# Patient Record
Sex: Female | Born: 1960 | Race: White | Hispanic: No | Marital: Married | Smoking: Never smoker
Health system: Southern US, Community
[De-identification: ages and names within clinical notes are randomized; demographics above are authoritative.]

## PROBLEM LIST (undated history)

## (undated) DIAGNOSIS — C801 Malignant (primary) neoplasm, unspecified: Secondary | ICD-10-CM

## (undated) DIAGNOSIS — I10 Essential (primary) hypertension: Secondary | ICD-10-CM

## (undated) DIAGNOSIS — E049 Nontoxic goiter, unspecified: Secondary | ICD-10-CM

## (undated) DIAGNOSIS — M5090 Cervical disc disorder, unspecified, unspecified cervical region: Secondary | ICD-10-CM

## (undated) DIAGNOSIS — E042 Nontoxic multinodular goiter: Secondary | ICD-10-CM

## (undated) HISTORY — PX: CHOLECYSTECTOMY: SHX55

## (undated) HISTORY — DX: Essential (primary) hypertension: I10

## (undated) HISTORY — DX: Cervical disc disorder, unspecified, unspecified cervical region: M50.90

## (undated) HISTORY — DX: Nontoxic goiter, unspecified: E04.9

## (undated) HISTORY — DX: Nontoxic multinodular goiter: E04.2

---

## 2007-07-08 DIAGNOSIS — E042 Nontoxic multinodular goiter: Secondary | ICD-10-CM

## 2007-07-08 HISTORY — DX: Nontoxic multinodular goiter: E04.2

## 2007-12-08 ENCOUNTER — Encounter: Admission: RE | Admit: 2007-12-08 | Discharge: 2007-12-08 | Payer: Self-pay | Admitting: Family Medicine

## 2007-12-08 ENCOUNTER — Ambulatory Visit: Payer: Self-pay | Admitting: Family Medicine

## 2007-12-08 DIAGNOSIS — M25519 Pain in unspecified shoulder: Secondary | ICD-10-CM

## 2007-12-13 ENCOUNTER — Encounter: Payer: Self-pay | Admitting: Family Medicine

## 2007-12-15 ENCOUNTER — Encounter: Payer: Self-pay | Admitting: Family Medicine

## 2008-01-07 ENCOUNTER — Ambulatory Visit: Payer: Self-pay | Admitting: Family Medicine

## 2008-01-07 DIAGNOSIS — I10 Essential (primary) hypertension: Secondary | ICD-10-CM

## 2008-01-11 ENCOUNTER — Encounter: Payer: Self-pay | Admitting: Family Medicine

## 2008-01-11 ENCOUNTER — Ambulatory Visit: Payer: Self-pay | Admitting: Family Medicine

## 2008-01-11 ENCOUNTER — Encounter: Admission: RE | Admit: 2008-01-11 | Discharge: 2008-01-11 | Payer: Self-pay | Admitting: Family Medicine

## 2008-01-11 ENCOUNTER — Observation Stay (HOSPITAL_COMMUNITY): Admission: AD | Admit: 2008-01-11 | Discharge: 2008-01-13 | Payer: Self-pay | Admitting: Family Medicine

## 2008-01-11 DIAGNOSIS — M542 Cervicalgia: Secondary | ICD-10-CM | POA: Insufficient documentation

## 2008-01-11 DIAGNOSIS — E042 Nontoxic multinodular goiter: Secondary | ICD-10-CM

## 2008-01-31 ENCOUNTER — Ambulatory Visit: Payer: Self-pay | Admitting: Family Medicine

## 2008-02-24 ENCOUNTER — Telehealth: Payer: Self-pay | Admitting: Family Medicine

## 2008-02-29 ENCOUNTER — Encounter: Payer: Self-pay | Admitting: Family Medicine

## 2008-03-06 ENCOUNTER — Encounter: Payer: Self-pay | Admitting: Family Medicine

## 2008-03-17 ENCOUNTER — Telehealth (INDEPENDENT_AMBULATORY_CARE_PROVIDER_SITE_OTHER): Payer: Self-pay | Admitting: *Deleted

## 2008-03-21 ENCOUNTER — Ambulatory Visit: Payer: Self-pay | Admitting: Family Medicine

## 2008-03-24 ENCOUNTER — Encounter: Payer: Self-pay | Admitting: Family Medicine

## 2008-03-30 ENCOUNTER — Encounter: Admission: RE | Admit: 2008-03-30 | Discharge: 2008-03-30 | Payer: Self-pay | Admitting: Neurology

## 2008-04-11 ENCOUNTER — Ambulatory Visit: Payer: Self-pay | Admitting: Family Medicine

## 2008-04-12 LAB — CONVERTED CEMR LAB
CO2: 25 meq/L (ref 19–32)
Calcium: 9.1 mg/dL (ref 8.4–10.5)
Glucose, Bld: 89 mg/dL (ref 70–99)
Potassium: 4.6 meq/L (ref 3.5–5.3)
Sodium: 136 meq/L (ref 135–145)

## 2008-05-05 ENCOUNTER — Encounter: Payer: Self-pay | Admitting: Family Medicine

## 2008-06-19 ENCOUNTER — Encounter: Admission: RE | Admit: 2008-06-19 | Discharge: 2008-06-19 | Payer: Self-pay | Admitting: Sports Medicine

## 2008-07-03 ENCOUNTER — Encounter: Admission: RE | Admit: 2008-07-03 | Discharge: 2008-07-05 | Payer: Self-pay | Admitting: Sports Medicine

## 2008-07-10 ENCOUNTER — Encounter: Admission: RE | Admit: 2008-07-10 | Discharge: 2008-07-18 | Payer: Self-pay | Admitting: Sports Medicine

## 2008-08-14 ENCOUNTER — Ambulatory Visit: Payer: Self-pay | Admitting: Family Medicine

## 2008-08-18 ENCOUNTER — Encounter: Admission: RE | Admit: 2008-08-18 | Discharge: 2008-08-18 | Payer: Self-pay | Admitting: Family Medicine

## 2008-09-17 ENCOUNTER — Telehealth: Payer: Self-pay | Admitting: Family Medicine

## 2008-11-09 ENCOUNTER — Telehealth: Payer: Self-pay | Admitting: Family Medicine

## 2008-12-11 ENCOUNTER — Ambulatory Visit: Payer: Self-pay | Admitting: Family Medicine

## 2008-12-11 DIAGNOSIS — R42 Dizziness and giddiness: Secondary | ICD-10-CM

## 2009-09-26 ENCOUNTER — Telehealth: Payer: Self-pay | Admitting: Family Medicine

## 2009-10-02 ENCOUNTER — Ambulatory Visit: Payer: Self-pay | Admitting: Family Medicine

## 2009-12-14 ENCOUNTER — Ambulatory Visit: Payer: Self-pay | Admitting: Family Medicine

## 2010-08-06 NOTE — Progress Notes (Signed)
Summary: Change BP med  Phone Note Call from Patient Call back at Home Phone [phone]   Caller: Patient Call For: Nani Gasser MD Summary of Call: Pt on Benicar for her BP and too expensive. Says you changed her husband to Losartan and if she could get this it is free on her plan. Please advise Initial call taken by: Kathlene November,  September 26, 2009 9:01 AM  Follow-up for Phone Call        Hasn'ty been seen in over one year for BP. so needs to come in for appt and we can change her at that time.  Follow-up by: Nani Gasser MD,  September 26, 2009 9:54 AM  Additional Follow-up for Phone Call Additional follow up Details #1::        Pt notified of MD instructions Additional Follow-up by: Kathlene November,  September 26, 2009 10:01 AM

## 2010-08-06 NOTE — Assessment & Plan Note (Signed)
Summary: 3 WK FU HTN   Vital Signs:  Patient Profile:   50 Years Old Female Height:     63.3 inches Weight:      158 pounds Pulse rate:   73 / minute BP sitting:   129 / 77  (left arm) Cuff size:   regular  Vitals Entered By: Kathlene November (April 11, 2008 9:02 AM)                 PCP:  Seymour Bars DO  Chief Complaint:  BP followup.  History of Present Illness: Here to FU BP.   Hypertension History:      She denies headache, chest pain, palpitations, dyspnea with exertion, orthopnea, PND, peripheral edema, visual symptoms, neurologic problems, syncope, and side effects from treatment.  She notes no problems with any antihypertensive medication side effects.        Positive major cardiovascular risk factors include hypertension.  Negative major cardiovascular risk factors include female age less than 49 years old, negative family history for ischemic heart disease, and non-tobacco-user status.       Current Allergies: ! SULFA      Physical Exam  General:     Well-developed,well-nourished,in no acute distress; alert,appropriate and cooperative throughout examination Head:     Normocephalic and atraumatic without obvious abnormalities. No apparent alopecia or balding. Eyes:     No corneal or conjunctival inflammation noted. EOMI. Perrla.  Neck:     No deformities, masses, or tenderness noted. Lungs:     Normal respiratory effort, chest expands symmetrically. Lungs are clear to auscultation, no crackles or wheezes. Heart:     Normal rate and regular rhythm. S1 and S2 normal without gallop, murmur, click, rub or other extra sounds.  No carotid bruits.     Impression & Recommendations:  Problem # 1:  ESSENTIAL HYPERTENSION, BENIGN (ICD-401.1) AT goal today.   Due to check K and Cr since starting an ARB.   Encourage to restart walking program.  Fu in 4 months. Due for cholesterol check as well.  Her updated medication list for this problem includes:    Cardizem  Cd 300 Mg Xr24h-cap (Diltiazem hcl coated beads) .Marland Kitchen... Take 1 tablet by mouth once a day    Benicar 20 Mg Tabs (Olmesartan medoxomil) .Marland Kitchen... Take 1 tablet by mouth once a day  BP today: 129/77 Prior BP: 141/94 (03/21/2008)  Prior 10 Yr Risk Heart Disease: Not enough information (03/21/2008)  Orders: T-Basic Metabolic Panel [phone]) T-Lipid Profile [phone])   Complete Medication List: 1)  Naproxen Dr 500 Mg Tbec (Naproxen) .... Take 1 tablet by mouth two times a day 2)  Diazepam 10 Mg Tabs (Diazepam) .Marland Kitchen.. 1 tab by mouth three times a day as needed neck pain/ spasm 3)  Percocet 5-325 Mg Tabs (Oxycodone-acetaminophen) .Marland Kitchen.. 1-2 tabs by mouth q 6 hrs as needed severe pain 4)  Cardizem Cd 300 Mg Xr24h-cap (Diltiazem hcl coated beads) .... Take 1 tablet by mouth once a day 5)  Benicar 20 Mg Tabs (Olmesartan medoxomil) .... Take 1 tablet by mouth once a day  Hypertension Assessment/Plan:      The patient's hypertensive risk group is category A: No risk factors and no target organ damage.  Today's blood pressure is 129/77.      Prescriptions: CARDIZEM CD 300 MG XR24H-CAP (DILTIAZEM HCL COATED BEADS) Take 1 tablet by mouth once a day  #30 x 5   Entered and Authorized by:   Nani Gasser MD  Signed by:   Nani Gasser MD on 04/11/2008   Method used:   [facility]ically to        [facility] [phone]* (retail)       [address] [place], [place]  [address]       Ph: [phone] or [phone]       Fax: [phone]   RxID:   [id number]  ]

## 2010-08-06 NOTE — Assessment & Plan Note (Signed)
Summary: BP CHECK//VGJ  Nurse Visit   Vital Signs:  Patient profile:   50 year old female Pulse rate:   74 / minute BP sitting:   124 / 77  (left arm) Cuff size:   regular  Vitals Entered By: Kathlene November (December 14, 2009 1:34 PM) CC: Follow-up HTN HPI: Taking Meds?yes Side Effects?no Chest Pain, SOB, Dizziness?no A/P: HTN(401.1) At Goal? If no, MD will be notified. Follow-up in--  5 minutes was spent with the pt.  Current Medications (verified): 1)  Cardizem Cd 300 Mg Xr24h-Cap (Diltiazem Hcl Coated Beads) .... Take 1 Tablet By Mouth Once A Day 2)  Veramyst 27.5 Mcg/spray Susp (Fluticasone Furoate) .... 2 Sprays Per Nostril Once Daily 3)  Losartan Potassium 50 Mg Tabs (Losartan Potassium) .... Take 1 Tablet By Mouth Once A Day in The Am.  Allergies (verified): 1)  ! Sulfa  Orders Added: 1)  Est. Patient Level I [[address]109] Prescriptions: LOSARTAN POTASSIUM 50 MG TABS (LOSARTAN POTASSIUM) Take 1 tablet by mouth once a day in the AM.  #30 x 4   Entered by:   Kathlene November   Authorized by:   Seymour Bars DO   Signed by:   Kathlene November on 12/14/2009   Method used:   [facility]ronically to        [facility] [phone]* (retail)       [address] [place], [place]  [address]       Ph: [phone] or [phone]       Fax: [phone]   RxID:   [id number]

## 2010-08-06 NOTE — Assessment & Plan Note (Signed)
Summary: HTN, Goiter   Vital Signs:  Patient profile:   50 year old female Height:      63.3 inches Weight:      164 pounds BMI:     28.88 Pulse rate:   83 / minute BP sitting:   121 / 75  (left arm) Cuff size:   regular  Vitals Entered By: Kathlene November (October 02, 2009 9:04 AM) CC: followup BP, Hypertension Management   CC:  followup BP and Hypertension Management.  Hypertension History:      She denies headache, chest pain, palpitations, dyspnea with exertion, orthopnea, PND, peripheral edema, visual symptoms, neurologic problems, syncope, and side effects from treatment.  She notes no problems with any antihypertensive medication side effects.  c/o occ swellingin her feet if eats too much salt. Marland Kitchen        Positive major cardiovascular risk factors include hypertension.  Negative major cardiovascular risk factors include female age less than 42 years old, negative family history for ischemic heart disease, and non-tobacco-user status.     Current Medications (verified): 1)  Cardizem Cd 300 Mg Xr24h-Cap (Diltiazem Hcl Coated Beads) .... Take 1 Tablet By Mouth Once A Day 2)  Benicar 20 Mg Tabs (Olmesartan Medoxomil) .... Take 1 Tablet By Mouth Once A Day 3)  Veramyst 27.5 Mcg/spray Susp (Fluticasone Furoate) .... 2 Sprays Per Nostril Once Daily  Allergies (verified): 1)  ! Sulfa  Comments:  Nurse/Medical Assistant: The patient's medications and allergies were reviewed with the patient and were updated in the Medication and Allergy Lists. Kathlene November (October 02, 2009 9:04 AM)  Physical Exam  General:  Well-developed,well-nourished,in no acute distress; alert,appropriate and cooperative throughout examination Head:  Normocephalic and atraumatic without obvious abnormalities. No apparent alopecia or balding. Neck:  Enlarged thyroid bilaterally.  Lungs:  Normal respiratory effort, chest expands symmetrically. Lungs are clear to auscultation, no crackles or wheezes. Heart:  Normal  rate and regular rhythm. S1 and S2 normal without gallop, murmur, click, rub or other extra sounds. Skin:  no rashes.   Cervical Nodes:  No lymphadenopathy noted Psych:  Cognition and judgment appear intact. Alert and cooperative with normal attention span and concentration. No apparent delusions, illusions, hallucinations   Impression & Recommendations:  Problem # 1:  ESSENTIAL HYPERTENSION, BENIGN (ICD-401.1) At goal. lOoks great. Wants to change to losartan for cost reasons.  Will change and then f/u to recheck pressure in 3 weeks.  Also due for labs and fasting cholesterol.  Call if any SE or concerns.  The following medications were removed from the medication list:    Benicar 20 Mg Tabs (Olmesartan medoxomil) .Marland Kitchen... Take 1 tablet by mouth once a day Her updated medication list for this problem includes:    Cardizem Cd 300 Mg Xr24h-cap (Diltiazem hcl coated beads) .Marland Kitchen... Take 1 tablet by mouth once a day    Losartan Potassium 50 Mg Tabs (Losartan potassium) .Marland Kitchen... Take 1 tablet by mouth once a day in the am.  BP today: 121/75 Prior BP: 151/91 (12/11/2008)  Prior 10 Yr Risk Heart Disease: Not enough information (03/21/2008)  Labs Reviewed: K+: 4.6 (04/11/2008) Creat: : 0.71 (04/11/2008)     Orders: T-Comprehensive Metabolic Panel ([id number]) T-Lipid Profile ([id number])  Problem # 2:  GOITER, MULTINODULAR (ICD-241.1) Due to recheck the TSH.  Last [place] was stable in 08/2008.   Orders: T-TSH ([id number])  Complete Medication List: 1)  Cardizem Cd 300 Mg Xr24h-cap (Diltiazem hcl coated beads) .... Take 1 tablet by mouth once a  day 2)  Veramyst 27.5 Mcg/spray Susp (Fluticasone furoate) .... 2 sprays per nostril once daily 3)  Losartan Potassium 50 Mg Tabs (Losartan potassium) .... Take 1 tablet by mouth once a day in the am.  Hypertension Assessment/Plan:      The patient's hypertensive risk group is category A: No risk factors and no target organ damage.  Today's blood pressure  is 121/75.  Her blood pressure goal is < 140/90.  Patient Instructions: 1)  Please schedule a follow-up appointment in 3 weeks to recheck Blood pressure.   Then can send a 90 day supply.   2)  We will call you with your lab results. Need to fast for your labs.   Prescriptions: LOSARTAN POTASSIUM 50 MG TABS (LOSARTAN POTASSIUM) Take 1 tablet by mouth once a day in the AM.  #30 x 0   Entered and Authorized by:   Nani Gasser MD   Signed by:   Nani Gasser MD on 10/02/2009   Method used:   Electronically to        [facility] [phone]* (retail)       [address] [place], [place]  [address]       Ph: [phone] or [phone]       Fax: [phone]   RxID:   [id number]

## 2010-09-24 ENCOUNTER — Other Ambulatory Visit: Payer: Self-pay | Admitting: Family Medicine

## 2010-09-24 ENCOUNTER — Telehealth: Payer: Self-pay | Admitting: *Deleted

## 2010-09-24 DIAGNOSIS — I10 Essential (primary) hypertension: Secondary | ICD-10-CM

## 2010-09-24 MED ORDER — LOSARTAN POTASSIUM 50 MG PO TABS: 50.0000 mg | ORAL_TABLET | Freq: Every day | ORAL | Status: DC

## 2010-09-24 NOTE — Telephone Encounter (Signed)
CValled in med. Pt aware to pick up.

## 2010-09-26 ENCOUNTER — Encounter: Payer: Self-pay | Admitting: Family Medicine

## 2010-10-02 ENCOUNTER — Ambulatory Visit (INDEPENDENT_AMBULATORY_CARE_PROVIDER_SITE_OTHER): Payer: BC Managed Care – PPO | Admitting: Family Medicine

## 2010-10-02 VITALS — BP 124/76 | HR 82 | Ht 63.3 in | Wt 165.0 lb

## 2010-10-02 DIAGNOSIS — E010 Iodine-deficiency related diffuse (endemic) goiter: Secondary | ICD-10-CM

## 2010-10-02 DIAGNOSIS — I10 Essential (primary) hypertension: Secondary | ICD-10-CM

## 2010-10-02 DIAGNOSIS — N390 Urinary tract infection, site not specified: Secondary | ICD-10-CM

## 2010-10-02 DIAGNOSIS — E049 Nontoxic goiter, unspecified: Secondary | ICD-10-CM

## 2010-10-02 LAB — POCT URINALYSIS DIPSTICK
Blood, UA: NEGATIVE
Glucose, UA: NEGATIVE
Leukocytes, UA: NEGATIVE
Nitrite, UA: NEGATIVE

## 2010-10-02 MED ORDER — CIPROFLOXACIN HCL 500 MG PO TABS: 500.0000 mg | ORAL_TABLET | Freq: Two times a day (BID) | ORAL | Status: AC

## 2010-10-02 MED ORDER — LOSARTAN POTASSIUM 50 MG PO TABS: 50.0000 mg | ORAL_TABLET | Freq: Every day | ORAL | Status: DC

## 2010-10-02 MED ORDER — DILTIAZEM HCL ER COATED BEADS 300 MG PO CP24: 300.0000 mg | ORAL_CAPSULE | Freq: Every day | ORAL | Status: DC

## 2010-10-02 MED ORDER — FLUTICASONE FUROATE 27.5 MCG/SPRAY NA SUSP: 2.0000 | Freq: Every day | NASAL | Status: DC

## 2010-10-02 NOTE — Progress Notes (Signed)
  Subjective:    Patient ID: Joy Perez, female    DOB: March 03, 1961, 50 y.o.   MRN: [id number]  Hypertension This is a chronic problem. Pertinent negatives include no chest pain, headaches, malaise/fatigue or shortness of breath. There are no associated agents to hypertension. Risk factors for coronary artery disease include no known risk factors. Past treatments include ACE inhibitors.  Urinary Tract Infection  The current episode started 1 to 4 weeks ago. The problem occurs intermittently. The problem has been gradually worsening. The quality of the pain is described as aching. The pain is mild (achey bilateral lowre quad and CVA area.). There has been no fever. There is no history of pyelonephritis. Associated symptoms include frequency. Pertinent negatives include no hematuria. She has tried nothing for the symptoms. There is no history of catheterization or recurrent UTIs.  She is c/o leaking of urine for 2 weeks. Odor is very strong.  Feels a tugging sensation when has to urinate. Symptoms have been abrupt.     Review of Systems  Constitutional: Negative for malaise/fatigue.  Respiratory: Negative for shortness of breath.   Cardiovascular: Negative for chest pain.  Genitourinary: Positive for frequency. Negative for hematuria.  Neurological: Negative for headaches.   Past Surgical History  Procedure Date  . Cholecystectomy    History   Social History  . Marital Status: Married    Spouse Name: N/A    Number of Children: N/A  . Years of Education: N/A   Occupational History  . Not on file.   Social History Main Topics  . Smoking status: Never Smoker   . Smokeless tobacco: Not on file  . Alcohol Use: No  . Drug Use:   . Sexually Active:    Other Topics Concern  . Not on file   Social History Narrative  . No narrative on file      Objective:   Physical Exam  Constitutional: She appears well-developed and well-nourished.  HENT:  Head: Normocephalic and  atraumatic.  Neck: Normal range of motion. Thyromegaly present.  Cardiovascular: Normal rate, regular rhythm and normal heart sounds.   Pulmonary/Chest: Effort normal and breath sounds normal.          Assessment & Plan:  1. HTN-  At goal. Due for labs.  2. Incontinence- With sudden onset like infection, though UA neg. Will send cx to confirm. Tx with cipro. Call if sxs not better. Then can conisder tx for incontinence or possible urology referral.  3.MNG - Recheck TSH. Had w/u about a year ago.

## 2010-10-02 NOTE — Patient Instructions (Signed)
Complete the antibiotics for the UTI. Call me if you are not feeling better in about 4-5 days.

## 2010-10-04 LAB — URINE CULTURE: Organism ID, Bacteria: NO GROWTH

## 2010-10-08 ENCOUNTER — Telehealth: Payer: Self-pay | Admitting: *Deleted

## 2010-10-08 NOTE — Telephone Encounter (Signed)
Message copied by Avon Gully on Tue Oct 08, 2010  2:58 PM ------      Message from: Nani Gasser      Created: Fri Oct 04, 2010  8:27 AM       Call pt: Urine cx is neg.

## 2010-10-08 NOTE — Telephone Encounter (Signed)
Left message on vm with results  

## 2010-11-19 NOTE — H&P (Signed)
NAME:  Joy Perez, Joy Perez NO.:  [id number]   MEDICAL RECORD NO.:  [id number]          PATIENT TYPE:  EMS   LOCATION:  [facility]                         FACILITY:  [facility]   PHYSICIAN:  Nestor Ramp, MD        DATE OF BIRTH:  Mar 11, 1961   DATE OF ADMISSION:  01/11/2008  DATE OF DISCHARGE:  01/11/2008                              HISTORY & PHYSICAL   PRIMARY CARE PHYSICIAN:  Seymour Bars, DO, at [facility] [place]   CHIEF COMPLAINT:  Neck pain.   HISTORY OF PRESENT ILLNESS:  The patient is a 50 year old female  transferred from [facility] for admission  for neck pain.   The patient states over the past month she has had worsening of neck  pain.  She states the pain started with discomfort on the right side  under her ear, which felt like she might have an ear infection or like  something was compressing that side.  She states that Wednesday she woke  up with a crick in her neck posteriorly and her neck felt very sore  and it was hard to move.  This progressed over the past few days until  Sunday when she was in severe pain and went to the ED at [facility].  She  has been taking ibuprofen, Vicodin, Valium without help and states they  just make her fall asleep.  She has no radiation of pain down her arms.  No bowel or bladder dysfunction.  Denies fevers.  She has had difficulty  swallowing, but states the pain from this is posterior neck pain and it  does not hurt in her throat when she swallows.  She denies shaking  chills, sweats, palpitations, dyspnea, or chest pain.  At the emergency  department per report, she had a negative lumbar puncture for  meningitis.  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  Blood pressure is elevated and  this has not been issue over the past few years since she  has lost 70  pounds on weight watchers.   PAST MEDICAL HISTORY:  None currently.   PAST SURGICAL HISTORY:  Status post cholecystectomy.   MEDICATIONS:  The patient takes no regular medications, but has been  taking naproxen, Valium, and Percocet as needed for her current  presentation.   ALLERGIES:  SULFA.   FAMILY HISTORY:  Father is alive and has hypertension and hypothyroidism  and had an acute MI at age 2.  Mother died of leukemia.  Her sister has  hypertension.  She has 1 brother who is healthy.  Her daughter as a  history of hyperthyroidism, which she took a medication, but did not  need to have an ablation or any surgery.   SOCIAL HISTORY:  The patient is married and has 2 daughters.  She works  for [employer] and has never smoked and only drinks alcohol  occasionally.  Denies illicit drug use.  She walks  3 days a week.   REVIEW OF SYSTEMS:  Please see HPI for complete details.   PHYSICAL EXAMINATION:  VITAL SIGNS:  Temperature 98.1, pulse 86,  respirations 20, blood pressure 188/95, and her pulse oxygen of 98% at  her primary care physician's office.  GENERAL:  No acute distress, well-nourished and well-developed, alert  and cooperative.  HEENT: Normocephalic, atraumatic.  Pupils are equal, round, and reactive  to light, extraocular movements intact.  No conjunctival inflammation,  base of tongue is elevated.  Otherwise, her pharynx is normal without  erythema or exudate.  The mucous membranes are moist.  External ear exam  was normal and TMs are without bulging or erythema.  No nasal discharge.  MUSCULOSKELETAL: C-spine noted no tenderness to palpation of the  paraspinal regions.  She has decreased range of motion especially  flexion and extension due to pain in posterior paraspinal regions,  however.  She has a negative Brudzinski sign.  NECK: Thyromegaly right greater than left with palpable mass on the  right side.  No tenderness to thyroid palpation.  No  posterior cervical  lymphadenopathy.  CARDIOVASCULAR: Borderline tachycardia, regular rhythm, no murmurs,  rubs, or gallops.  LUNGS:  Normal respiratory effort, clear to auscultation bilaterally,  wheezes, rales, or rhonchi.  ABDOMEN:  Soft, nontender, and nondistended.  Bowel sounds are  normoactive.  No hepatosplenomegaly and no masses.  EXTREMITIES:  Warm, well-perfused.  No edema or cyanosis.  SKIN: No rashes or lesions on the visible skin.  NEURO:  Alert and oriented x3.  Cranial nerves II-XII are grossly  intact.  She is moving all extremities.  Strength is 5/5.  Per primary  care physician, all extremities and muscle stretch reflexes are equal  and normal.   LABORATORY DATA:  Labs and studies in the system, the patient outpatient  had a ultrasound of the head and neck today, which showed an enlarged  inhomogeneous nodular thyroid gland was consistent with multinodular  goiter with a dominant nodule the right of 2.3 x 1.6 x 2.1 cm.  There  are also two nodules on the left smaller size.   ASSESSMENT AND PLAN:  A 50 year old female with:  1. Neck pain.  The patient's pain is more with movement and she does      not have a tender thyroid.  Her pain with swallowing is not      internal, but rather is in her neck posteriorly inconsistent with      true dysphasia or odynophagia.  Cervical x-ray showed DJD per      report.  Per patient report, she also had CT and MRIs that were      done, but we do not have these records, which would be helpful.      She has no bowel or bladder symptoms, no fevers, no other frank      symptoms, so we will obtain records and ROI was signed and faxed to      [facility].  She had no evidence of meningitis.  The patient appears      well.  She has no signs or symptoms of nerve compression either.      We will treat her pain with Percocet and morphine in the meantime.  2. Elevated blood pressure, will treat with metoprolol 50 mg b.i.d.      and have a  low-threshold for discontinue.  Given the patient had      some gastrointestinal distress with this, but felt that if this  is      related to her thyroid, the beta-blocker would be the most      appropriate therapy.  We will give labetalol as need for blood      pressure greater than 180/110 as well.  3. Multinodular goiter.  I reviewed the ultrasound with interventional      radiology who recommended checking the patient's thyroid function      tests and to have follow up in 6 months for this issue.  On      ultrasound it is difficult to assess for compressive nature on      trachea or esophagus but per IR, this is very rare but possible.      We will see if      a CT was done at [facility] if not we will order this.  No tenderness,      fever, palpitations.  We will check thyroid function tests      including a TSH, free T3 and free T4.  4. Thyromegaly.  Please see #3 above.      Norton Blizzard, M.D.  Electronically Signed      Nestor Ramp, MD  Electronically Signed    SH/MEDQ  D:  01/11/2008  T:  01/12/2008  Job:  [id number]

## 2010-11-19 NOTE — Consult Note (Signed)
NAME:  Joy Perez, Joy Perez NO.:  [id number]   MEDICAL RECORD NO.:  [id number]         PATIENT TYPE:  CINP   LOCATION:                               FACILITY:  [facility]   PHYSICIAN:  Melvyn Novas, M.D.  DATE OF BIRTH:  01/11/1961   DATE OF CONSULTATION:  DATE OF DISCHARGE:  12/04/2007                                 CONSULTATION   REQUESTING PHYSICIAN:  Nestor Ramp, MD   CHIEF COMPLAINT:  Admission for increasing neck pain.   This patient is a 50 year old Caucasian right-handed married female who  states that for over a month she had some tenderness and abnormal  sensation in her right nape of the neck.  She also states that she  wondered for a while if some of her hair accessories she may have pulled  on a hair root making her more sensitive.  The feeling was that of  irritated skin rather than a deep ache; however, this quality of pain  changed and she felt for about 2 weeks prior to her presentation here as  if there may be an inflammation or infection or swelling in the right  nuchal area.  On Wednesday last week, today is Thursday, she presented  to the [facility] ER with a very increased sensation of neck pain, now  associated with a trigger by swallowing food.  On Sunday, she went back  to the ER with severe neck pain, received morphine and Valium without  benefit, but was very drowsy.  At this time, a CSF study was obtained  ruling out a viral or bacterial meningitis.  There were no white blood  cells found according to the patient's verbal report, and she had a CT  scan of the brain which showed no swelling or abnormality.  The study  was obtained unenhanced.  Here this morning, the patient had an MRI of  the brain again without gadolinium.  Her MRI shows scattered smallish  nonconfluent T2 lesions in the periventricular region and one single  right lateralized pontine 1 x 1 x 1 cm area that was seen on T2 studies  and may be demyelinating inflammation or  neoplastic in nature.   The patient endorsed dysphagia and states that the swallowing was not  the problem, but swallowing triggered a sensation of neck pain in the  nuchal occipital region.  She has no incontinence, no mental status  changes, no fever, no rash, no shortness of breath, no nausea, and no  diarrhea.  The patient was in pain when her blood pressure was last  obtained and was as high as 180/86.  I have no recent vital signs  available here.  Her heart rate is 63.  Her respiratory rate is 18.  Lungs are clear to auscultation.  There is no peripheral clubbing,  cyanosis, or edema.  No bruising.  There is normal capillary refill.  She has no temporal artery induration.   PAST MEDICAL HISTORY:  The patient is a formally rather obese Caucasian  female that loss over 70 pounds on the Weight Watcher diet and is a  regular  exercise now.  She has been able to control hypertension which  was obesity related by losing weight.  Her hypertension during this  admission may be related to the headache and not be indicated at  baseline.  She also has a history of thyroiditis and hyperthyroidism.   SOCIAL HISTORY:  She is status post cholecystectomy.   FAMILY HISTORY:  Her father had an MI at 20, hypertension and  hyperthyroidism is also noted.  Her mother died of leukemia.  Her sister  has hypertension.   SOCIAL HISTORY:  The patient has 2 daughters, is are married, nonsmoker,  rarely drinking.   Physical exam in the late lunch hour showed a blood pressure of 125/83,  a respiratory rate of 15, a 72 regular pulse, and 36 plus 3 degrees  Celsius body temperature.  These were obtained prior to our exam.   Her current medications include Lopressor, morphine, sulfa, labetalol,  Ativan, and Percocet.   The patient is alert, pleasant, and fully oriented in no acute distress.  Neck pain has improved.  She feels a slight tightness, but has no true  pain anymore.  Symptoms are still localized  to the right nuchal area.  The patient still does state that sometimes when she swallows she feels  as if there is a pressure on the right nuchal area.  I would consider  her mental status to be fully intact without a positive dysarthria or  apraxia.  Cranial nerve examination shows full facial symmetry.  Symmetric motor function.  Tongue and uvula are midline.  The patient  has no indurated temporal arteries.  No TMJ pain.  She has full  extraocular movements, equal pupillary reaction to light, and no  diplopia or disconjugation.  Motor examination shows equal tone,  strength, and mass bilaterally.  Reflexes on the lower extremity rather  brisk 2+, but symmetrically so without clonus and has downgoing Babinski  responses.  The upper extremities are only 1+ reflex levels, showing  normal finger-to-nose and rapid alternating movement testing.  Gait was  tested and was intact.  Sensory was intact to primary modalities, touch,  pinprick, and vibration.   ASSESSMENT:  The patient's MRI is definitely abnormal, but lack the  ability to correlate her complains with the image of her right pontine  lesions that may be subacute.  I have called [facility] to run  oligoclonal bands on the CSF that was already obtained.  If the CSF is  not kept for this period of time, we may need to repeat a spinal tap.  I  would repeat a gadolinium-enhanced MRI of the brain in 3 months.  If no  clinical symptoms return, I would allow a repeat in 12 months with the  intent to rule out multiple sclerosis or neoplasm.  I will also suggest  that we run a Lyme disease and catscratch disease titer as pontine  lesions occur rarely.  This is the kind of infection for which I have  not yet seen her being screened.  A white blood cell count, her H&H, and  her metabolic panel were in normal range.   A revisit with me in 2-3 months in my office is recommended, call [phone].      Melvyn Novas, M.D.      CD/MEDQ  D:  01/13/2008  T:  01/14/2008  Job:  [id number]

## 2010-11-19 NOTE — Consult Note (Signed)
NAME:  Joy Perez, Joy Perez NO.:  [id number]   MEDICAL RECORD NO.:  [id number]         PATIENT TYPE:  CINP   LOCATION:                               FACILITY:  [facility]   PHYSICIAN:  Melvyn Novas, M.D.  DATE OF BIRTH:  Mar 22, 1961   DATE OF CONSULTATION:  DATE OF DISCHARGE:  12/04/2007                                 CONSULTATION   REQUESTING PHYSICIAN:  Nestor Ramp, MD   CHIEF COMPLAINT:  Admission for increasing neck pain.   This patient is a 50 year old Caucasian right-handed married female who  states that for over a month she had some tenderness and abnormal  sensation in her right nape of the neck.  She also states that she wondered for a while if some of her hair  accessories she may have pulled on a hair root making her more  sensitive.  The feeling was that of irritated skin rather than a deep  ache; however, this quality of pain changed and she felt for about 2  weeks prior to her presentation here as if there may be an inflammation  or infection or swelling in the right nuchal area.  On Wednesday last week, today is Thursday, she presented to the [facility]  ER with a very increased sensation of neck pain, now associated with a  trigger by swallowing food.  On Sunday, she went back to the ER with severe neck pain, received  morphine and Valium without benefit, but was very drowsy.  At this time, a CSF study was obtained ruling out a viral or bacterial  meningitis.  There were no white blood cells found according to the  patient's verbal report, and she had a CT scan of the brain which showed  no swelling or abnormality.  The study was obtained unenhanced.  Here this morning, the patient had an MRI of the brain again without  gadolinium.  Her MRI shows scattered smallish nonconfluent T2 lesions in  the periventricular region and one single right lateralized pontine 1 x  1 x 1 cm area that was seen on T2 studies and may be demyelinating ,  caused by  inflammation or ( unlikely) neoplastic in nature.   The patient endorsed dysphagia and states that the swallowing was not  the problem, but swallowing triggered a sensation of neck pain in the  nuchal occipital region.  She has no incontinence, no mental status  changes, no fever, no rash, no shortness of breath, no nausea, and no  diarrhea.  The patient was in pain when her blood pressure was last  obtained and was as high as 180/86.  I have no recent vital signs available here.  Her heart rate is 63.  Her  respiratory rate is 18.  Lungs are clear to auscultation.  There is no  peripheral clubbing, cyanosis, or edema.  No bruising.  There is normal capillary refill.  She has no temporal  artery induration.   PAST MEDICAL HISTORY:  The patient is a formally rather obese Caucasian  female that loss over 70 pounds on the Weight Watcher diet and is  a  regular exercise now.  She has been able to control hypertension which was obesity related by  losing weight.  Her hypertension during this admission may be related to  the headache and not be indicated at baseline.  She also has a history  of thyroiditis and hyperthyroidism.   SOCIAL HISTORY:  She is status post cholecystectomy.   FAMILY HISTORY:  Her father had an MI at 45, hypertension and  hyperthyroidism is also noted.  Her mother died of leukemia.  Her sister  has hypertension.   SOCIAL HISTORY:  The patient has 2 daughters, is are married, nonsmoker,  rarely drinking.   Physical exam in the late lunch hour showed a blood pressure of 125/83,  a respiratory rate of 15, a 72 regular pulse, and 36 plus 3 degrees  Celsius body temperature.  These were obtained prior to our exam.   Her current medications include Lopressor, morphine, sulfa, labetalol,  Ativan, and Percocet.   The patient is alert, pleasant, and fully oriented in no acute distress.  Neck pain has improved.  She feels a slight tightness, but has no true  pain  anymore.  Symptoms are still localized to the right nuchal area.  The patient still does state that sometimes when she swallows she feels  as if there is a pressure on the right nuchal area.  I would consider  her mental status to be fully intact without a positive dysarthria or  apraxia.  Cranial nerve examination shows full facial symmetry.  Symmetric motor function.  Tongue and uvula are midline.  The patient  has no indurated temporal arteries.  No TMJ pain.  She has full  extraocular movements, equal pupillary reaction to light, and no  diplopia or disconjugation.  Motor examination shows equal tone,  strength, and mass bilaterally.  Reflexes on the lower extremity rather  brisk 2+, but symmetrically so without clonus and has downgoing Babinski  responses.  The upper extremities are only 1+ reflex levels, showing  normal finger-to-nose and rapid alternating movement testing.  Gait was  tested and was intact.  Sensory was intact to primary modalities, touch,  pinprick, and vibration.   ASSESSMENT:  The patient's MRI is definitely abnormal, but lack the  ability to correlate her complains with the image of her right pontine  lesions that may be subacute.  I have called [facility] to run  oligoclonal bands on the CSF that was already obtained.  If the CSF is  not kept for this period of time, we may need to repeat a spinal tap.  I  would repeat a gadolinium-enhanced MRI of the brain in 3 months.  If no clinical symptoms return, I would allow a repeat in 12 months with  the intent to rule out multiple sclerosis or neoplasm.  I will also suggest that we run a Lyme disease and catscratch disease  titer .  A white blood cell count, her H&H, and her metabolic panel were  in normal range.   A revisit with me in 2-3 months in my office is recommended, call [phone].      Melvyn Novas, M.D.  Electronically Signed     CD/MEDQ  D:  01/13/2008  T:  01/14/2008  Job:  [id number]

## 2010-11-22 NOTE — Discharge Summary (Signed)
NAME:  Joy Perez, Joy Perez NO.:  [id number]   MEDICAL RECORD NO.:  [id number]          PATIENT TYPE:  EMS   LOCATION:  [facility]                         FACILITY:  [facility]   PHYSICIAN:  Nestor Ramp, MD        DATE OF BIRTH:  [birth year]   DATE OF ADMISSION:  01/11/2008  DATE OF DISCHARGE:  01/11/2008                               DISCHARGE SUMMARY   PRIMARY CARE Raygen Linquist:  Dr. Seymour Bars at [facility].   DISCHARGE DIAGNOSES:  1. Neck pain.  2. Multinodular goiter.  3. Increased blood pressure without diagnosis of hypertension.   DISCHARGE MEDICATIONS:  1. Metoprolol 50 mg p.o. b.i.d.  2. Flexeril 5 mg p.o. t.i.d. for muscle spasm/neck pain.  This is a      new medication.  3. Vicodin 5/500 one-two tablets by mouth q.4-6 h. p.r.n. severe pain.      This is a new medication.   CONSULT:  Neurology was consulted, Dr. Vickey Huger was consulted to see the  patient when the MRI of the brain showed some concerning area.   PROCEDURES:  The patient had an MRI and MRA done here at [facility] and  multiple studies done at [facility].  The patient had all the studies that  were done at [facility].  Here at [facility], the MRI, MRA showed lesions  in the pons area that were suspicious for multiple sclerosis or  neoplasm.   LABORATORY:  Labs during this admission showed TSH of 7.371, free T4 of  1.44, and free T3 of 3.1.  CSF from [place] showed normal glucose,  normal protein, no white blood cells, and no signs of meningitis.  The  MRI at [facility] shows there is a focal C3-C4 bulging.  CT at [facility] was  negative except for an enlarged thyroid.  The thyroid ultrasound showed  enlarged inhomogeneous and nodular thyroid gland most consistent with  multinodular goiter with the dominant thyroid nodule on the right 2.3 x  1.6 x 2.1 cm.  The x-ray of the cervical spine was normal.   BRIEF HOSPITAL COURSE:  The patient was admitted after complaining of  having severe back neck  pain that was sustained in the low back of the  head area.  The patient was having this pain for a week.  We placed her  on some pain medication while we followed some studies to find the  source of the pain.  Our differentials included compression of the spine  and when that was shown to be negative by MRI of the spine, we did an  MRI of the brain to find a focal lesion in the brain; when that MRI of  brain showed some concerns for in the pons area, we consulted Neurology.  Dr. Vickey Huger saw the patient and after reviewing the MRI, she was  concerned that this area could be concerning for multiple sclerosis or  neoplasm.  She contacted [place] where an LP was performed.  They had  some CSF that were still kept there frozen.  She ordered a CSF to look  for oligoclonal  band, which would be an indication for multiple  sclerosis.  She has asked for the patient to follow up with her in 8  weeks.  At that time, she would determine for her what to have further  workup of this neck pain.   1. Neck pain.  The source of neck pain is not yet determined.  MRI of      the neck does not show compression or impingement.  MRA and MRI      were suspicious for multiple sclerosis, but as per Dr. Vickey Huger,      multiple sclerosis rarely presents in this way.  The patient is a      healthy 50 year old.  Multiple sclerosis rarely shows with neck      pain.  The patient was discharged with some Flexeril because this      neck pain could be related more to muscle spasm versus other      already mentioned differential diagnosis.  She was also given      Vicodin for severe pain, and she was asked to follow up with Dr.      Cathey Endow at [facility].  2. Multinodular goiter.  [facility] does not feel that      this needs to be treated as an inpatient and recommend that the      patient follow up with an endocrinologist on an outpatient basis.      The patient may also need to follow up  with a surgeon.  This can be      facilitated by patient's primary care physician.  3. Increase in blood pressure with our diagnosis of hypertension.  Her      blood pressure is better controlled during this admission.  Her      increase in blood pressure may be related to the amount of pain      that she was in.  The patient was discharged home on metoprolol 50      mg p.o. b.i.d. and asked to follow up with primary care physician      regarding workup for hypertension or to discontinue this      medication.   DISCHARGE INSTRUCTIONS:  Activity:  Increased activity slowly.  Diet:  Ad lib.   FOLLOW UP APPOINTMENT:  The patient is to follow up with Dr. Cathey Endow in 2  weeks.  Please call her office for an appointment and please also follow  up with Endocrinology regarding thyroid.  The patient is also to follow  up with Dr. Vickey Huger in 8 weeks and her telephone number is [phone].   DISCHARGE CONDITION:  The patient was discharged home in a stable  medical condition.      Angeline Slim, MD  Electronically Signed      Nestor Ramp, MD  Electronically Signed    CT/MEDQ  D:  01/14/2008  T:  01/14/2008  Job:  [id number]   cc:   Dr. Seymour Bars

## 2011-04-03 LAB — BASIC METABOLIC PANEL
BUN: 7
CO2: 28
Chloride: 105
Creatinine, Ser: 0.61
GFR calc Af Amer: >60
Glucose, Bld: 96

## 2011-04-03 LAB — COMPREHENSIVE METABOLIC PANEL
ALT: 15
AST: 19
Albumin: 3.6
Alkaline Phosphatase: 44
CO2: 30
Chloride: 104
GFR calc Af Amer: >60
GFR calc non Af Amer: >60
Potassium: 4
Total Bilirubin: 0.7

## 2011-04-03 LAB — CBC
MCHC: 33.3
MCV: 84.6
Platelets: 183
RBC: 4.01
RBC: 4.29
RDW: 14.9
WBC: 6

## 2011-04-03 LAB — DIFFERENTIAL
Basophils Absolute: 0
Basophils Relative: 0
Eosinophils Absolute: 0.1
Eosinophils Relative: 2
Monocytes Absolute: 0.5

## 2011-05-03 ENCOUNTER — Encounter: Payer: Self-pay | Admitting: Family Medicine

## 2011-05-03 ENCOUNTER — Inpatient Hospital Stay (INDEPENDENT_AMBULATORY_CARE_PROVIDER_SITE_OTHER)
Admission: RE | Admit: 2011-05-03 | Discharge: 2011-05-03 | Disposition: A | Discharge status: Home / Self Care | Payer: BC Managed Care – PPO | Source: Ambulatory Visit | Attending: Family Medicine | Admitting: Family Medicine

## 2011-05-03 DIAGNOSIS — J069 Acute upper respiratory infection, unspecified: Secondary | ICD-10-CM

## 2011-06-09 NOTE — Progress Notes (Signed)
Summary: cold/TM (room 5)   Vital Signs:  Patient Profile:   50 Years Old Female CC:      cough and cold x 1 week Height:     63.3 inches Weight:      169 pounds O2 Sat:      96 % O2 treatment:    Room Air Temp:     98.4 degrees F oral Pulse rate:   78 / minute Resp:     16 per minute BP sitting:   131 / 87  (left arm) Cuff size:   regular  Vitals Entered By: Lavell Islam RN (May 03, 2011 12:13 PM)             Comments took alka-seltzer cough/cold 1 hour ago      Updated Prior Medication List: CARDIZEM CD 300 MG XR24H-CAP (DILTIAZEM HCL COATED BEADS) Take 1 tablet by mouth once a day LOSARTAN POTASSIUM 50 MG TABS (LOSARTAN POTASSIUM) Take 1 tablet by mouth once a day in the AM.  Current Allergies (reviewed today): ! SULFAHistory of Present Illness Chief Complaint: cough and cold x 1 week History of Present Illness:  Subjective: Patient complains of URI symptoms that started one week ago with sore throat (now resolved) followed by sinus congestion and cough + cough for 3 days, worse at night No pleuritic pain No wheezing + post-nasal drainage No sinus pain/pressure No itchy/red eyes No earache No hemoptysis No SOB No fever/chills No nausea No vomiting No abdominal pain No diarrhea No skin rashes + fatigue No myalgias + headache Used OTC meds without relief   REVIEW OF SYSTEMS Constitutional Symptoms      Denies fever, chills, night sweats, weight loss, weight gain, and fatigue.  Eyes       Denies change in vision, eye pain, eye discharge, glasses, contact lenses, and eye surgery. Ear/Nose/Throat/Mouth       Complains of sinus problems and hoarseness.      Denies hearing loss/aids, change in hearing, ear pain, ear discharge, dizziness, frequent runny nose, frequent nose bleeds, and tooth pain or bleeding.  Respiratory       Complains of dry cough.      Denies productive cough, wheezing, shortness of breath, asthma, bronchitis, and emphysema/COPD.    Cardiovascular       Denies murmurs, chest pain, and tires easily with exhertion.    Gastrointestinal       Denies stomach pain, nausea/vomiting, diarrhea, constipation, blood in bowel movements, and indigestion. Genitourniary       Denies painful urination, kidney stones, and loss of urinary control. Neurological       Denies paralysis, seizures, and fainting/blackouts. Musculoskeletal       Denies muscle pain, joint pain, joint stiffness, decreased range of motion, redness, swelling, muscle weakness, and gout.  Skin       Denies bruising, unusual mles/lumps or sores, and hair/skin or nail changes.  Psych       Denies mood changes, temper/anger issues, anxiety/stress, speech problems, depression, and sleep problems. Other Comments: cough and cold x 1 week   Past History:  Past Medical History: Reviewed history from 03/24/2008 and no changes required. HTN--Ace cough, HCTZ -- dizziness, Toprol- GI upset multinodular goiter 2009 - Thyroid bx consistent with  hyperplastic nodules form 03-06-08 cervical disc dz Enlarged thyroid.   Past Surgical History: Reviewed history from 12/08/2007 and no changes required. Cholecystectomy    Family History: Reviewed history from 01/11/2008 and no changes required. father alive, HTN, AMI at  36, hypothyroidism Mother died of leukemia. sister HTN Brother healthy Daughter - h/o hyperthyroidism  Social History: Reviewed history from 08/14/2008 and no changes required. Works for [employer]. Has Assoc Degree. Married to [employer] with 2 daughters. Never smoked. Denies ETOH. Walks 3 days/ wk. Good diet   Objective:  No acute distress  Eyes:  Pupils are equal, round, and reactive to light and accomodation.  Extraocular movement is intact.  Conjunctivae are not inflamed.  Ears:  Canals normal.  Tympanic membranes normal.   Nose:  Mildly congested turbinates.  No sinus tenderness  Pharynx:  Normal  Neck:  Supple.   Tender  shotty posterior nodes are palpated bilaterally.  Lungs:  Clear to auscultation.  Breath sounds are equal.  Heart:  Regular rate and rhythm without murmurs, rubs, or gallops.  Abdomen:  Nontender without masses or hepatosplenomegaly.  Bowel sounds are present.  No CVA or flank tenderness.  Extremities:  No edema.  Assessment New Problems: UPPER RESPIRATORY INFECTION, ACUTE (ICD-465.9)  NO EVIDENCE BACTERIAL INFECTION TODAY  Plan New Medications/Changes: AMOXICILLIN 875 MG TABS (AMOXICILLIN) One by mouth two times a day (Rx void after 05/11/11)  #20 x 0, 05/03/2011, Donna Christen MD CHERATUSSIN AC 100-10 MG/5ML SYRP (GUAIFENESIN-CODEINE) 5cc to 10cc by mouth hs as needed cough.  May repeat 4 to 6hr later.  #4oz x 0, 05/03/2011, Donna Christen MD  New Orders: Pulse Oximetry (single measurment) [phone] New Patient Level III [id number] Services provided After hours-Weekends-Holidays [99051] Planning Comments:   Treat symptomatically for now:  Increase fluid intake, begin expectorant, topical decongestant,  cough suppressant at bedtime.  If fever/chills/sweats persist, or if not improving 5  days begin Z-pack (given Rx to hold).  Followup with PCP if not improving 7 to 10 days.   The patient and/or caregiver has been counseled thoroughly with regard to medications prescribed including dosage, schedule, interactions, rationale for use, and possible side effects and they verbalize understanding.  Diagnoses and expected course of recovery discussed and will return if not improved as expected or if the condition worsens. Patient and/or caregiver verbalized understanding.  Prescriptions: AMOXICILLIN 875 MG TABS (AMOXICILLIN) One by mouth two times a day (Rx void after 05/11/11)  #20 x 0   Entered and Authorized by:   Donna Christen MD   Signed by:   Donna Christen MD on 05/03/2011   Method used:   Print then Give to Patient   RxID:   [id number] CHERATUSSIN AC 100-10 MG/5ML SYRP  (GUAIFENESIN-CODEINE) 5cc to 10cc by mouth hs as needed cough.  May repeat 4 to 6hr later.  #4oz x 0   Entered and Authorized by:   Donna Christen MD   Signed by:   Donna Christen MD on 05/03/2011   Method used:   Print then Give to Patient   RxID:   [id number]   Patient Instructions: 1)  Take Mucinex (guaifenesin) twice daily for congestion. 2)  Increase fluid intake, rest. 3)  May use Afrin nasal spray (or generic oxymetazoline) twice daily for about 5 days.  Also recommend using saline nasal spray several times daily and/or saline nasal irrigation. 4)  Begin Amoxicillin if not improving about 5 days or if persistent fever develops. 5)  Followup with family doctor if not improving 7 to 10 days.   Orders Added: 1)  Pulse Oximetry (single measurment) [94760] 2)  New Patient Level III [27253] 3)  Services provided After hours-Weekends-Holidays [99051]

## 2011-09-12 ENCOUNTER — Ambulatory Visit: Payer: BC Managed Care – PPO | Admitting: Physician Assistant

## 2011-09-29 ENCOUNTER — Other Ambulatory Visit: Payer: Self-pay | Admitting: Family Medicine

## 2011-09-29 NOTE — Telephone Encounter (Signed)
Needs appointment

## 2011-10-16 ENCOUNTER — Other Ambulatory Visit: Payer: Self-pay | Admitting: Family Medicine

## 2011-12-05 ENCOUNTER — Other Ambulatory Visit: Payer: Self-pay | Admitting: Family Medicine

## 2011-12-22 ENCOUNTER — Other Ambulatory Visit: Payer: Self-pay | Admitting: Family Medicine

## 2012-03-04 ENCOUNTER — Other Ambulatory Visit: Payer: Self-pay | Admitting: Family Medicine

## 2012-04-05 ENCOUNTER — Encounter: Payer: Self-pay | Admitting: Family Medicine

## 2012-04-05 ENCOUNTER — Ambulatory Visit (INDEPENDENT_AMBULATORY_CARE_PROVIDER_SITE_OTHER): Payer: 59

## 2012-04-05 ENCOUNTER — Ambulatory Visit (INDEPENDENT_AMBULATORY_CARE_PROVIDER_SITE_OTHER): Payer: 59 | Admitting: Family Medicine

## 2012-04-05 VITALS — BP 156/86 | HR 81 | Wt 176.0 lb

## 2012-04-05 DIAGNOSIS — R209 Unspecified disturbances of skin sensation: Secondary | ICD-10-CM

## 2012-04-05 DIAGNOSIS — R32 Unspecified urinary incontinence: Secondary | ICD-10-CM

## 2012-04-05 DIAGNOSIS — M545 Low back pain: Secondary | ICD-10-CM

## 2012-04-05 DIAGNOSIS — R05 Cough: Secondary | ICD-10-CM

## 2012-04-05 DIAGNOSIS — M5137 Other intervertebral disc degeneration, lumbosacral region: Secondary | ICD-10-CM

## 2012-04-05 DIAGNOSIS — Z23 Encounter for immunization: Secondary | ICD-10-CM

## 2012-04-05 DIAGNOSIS — E049 Nontoxic goiter, unspecified: Secondary | ICD-10-CM

## 2012-04-05 DIAGNOSIS — D649 Anemia, unspecified: Secondary | ICD-10-CM

## 2012-04-05 DIAGNOSIS — R200 Anesthesia of skin: Secondary | ICD-10-CM

## 2012-04-05 MED ORDER — LOSARTAN POTASSIUM 100 MG PO TABS: 100.0000 mg | ORAL_TABLET | Freq: Every day | ORAL | Status: DC

## 2012-04-05 NOTE — Progress Notes (Signed)
Subjective:    Patient ID: Joy Perez, female    DOB: February 06, 1961, 51 y.o.   MRN: [id number]  HPI Urinary leaking.  Works form home from [employer] sit in her chair for 14 hours a day.  Martie Lee will feels damp.  Doesn't leak when she stands up.  Says when lays flat she leaks as well. No leaking with sneezing or coughing. She has never been treated for this before.   Had a severe cold in July. Got better but still has an occ hacking productive cough. No fever or other sxs.  Wonders if could be allergies.  Says mucinex DM does seem to help.  No reflux sxs  Started getting tingling in her right upper thigh on the front of the the thigh and towards her knee. No numbness. Says does sit in a chair all day and does sit with her other leg under her. She is getting some soreness in her low back.  Thigh is not painful and doesn's keep her from walking.  Low back is mild and is just sore.   Review of Systems     Objective:   Physical Exam  Constitutional: She is oriented to person, place, and time. She appears well-developed and well-nourished.  HENT:  Head: Normocephalic and atraumatic.  Right Ear: External ear normal.  Left Ear: External ear normal.  Nose: Nose normal.  Mouth/Throat: Oropharynx is clear and moist.       TMs and canals are clear.   Eyes: Conjunctivae normal and EOM are normal. Pupils are equal, round, and reactive to light.  Neck: Neck supple. No thyromegaly present.       Thyroid is enlarged and feels larger on the right. Hx of goiter.   Cardiovascular: Normal rate, regular rhythm and normal heart sounds.   Pulmonary/Chest: Effort normal and breath sounds normal. She has no wheezes.  Musculoskeletal:       Hip, knee, ankle strength is 5 out of 5 bilaterally. She's nontender over the lumbar spine or paraspinous muscles. Nontender over the SI joints. Negative straight leg raise bilaterally. Sensation is intact in both eyes. No lower Schimke swelling.  Lymphadenopathy:   She has no cervical adenopathy.  Neurological: She is alert and oriented to person, place, and time.  Skin: Skin is warm and dry.  Psychiatric: She has a normal mood and affect.          Assessment & Plan:  Urinary leaking - Would recommend Urology referral for further evaluation.  I sent her that her incontinence is a little bit more complicated. It's not typical of stress incontinence or urgency incontinence. It is a more positional since it only happens with prolonged sitting and with lying back. Doesn't seem to happen when standing. Consider this could be related to her back there she's not having any perineal numbness or problems.   Right thigh numbness - unclear etiology. This could be coming from her low back especially since she's noticed more discomfort and pain recently. No injury to her back she does sit for hours at a time at her job. Consider this could also be related to the incontinence and she denies any numbness in the actual groin or perineum. We will start with an x-ray of the lumbar spine. She may have a herniated disc at L2.    Cough - could be post viral cough but unusually long time.  Illness was 8-9 weeks ago. Will get CXR today. If neg consider treating for allergies vs GERD.  Consider treating for allergies first with an OTC claritin, since no heartburn sxs.  F/u in one month.    Flu vaccine given today.   HTN- uncontrolled. We will increase her losartan to 100 mg. Followup in 1 months.   Goiter - Recheck TSh. Last was in 2009.

## 2012-04-05 NOTE — Patient Instructions (Addendum)
We will call you with your lab results. If you don't here from us in about a week then please give us a call at [phone].  

## 2012-04-06 ENCOUNTER — Other Ambulatory Visit: Payer: Self-pay | Admitting: Family Medicine

## 2012-04-06 ENCOUNTER — Encounter: Payer: Self-pay | Admitting: Family Medicine

## 2012-04-06 DIAGNOSIS — D509 Iron deficiency anemia, unspecified: Secondary | ICD-10-CM | POA: Insufficient documentation

## 2012-04-06 LAB — VITAMIN B12: Vitamin B-12: 264 pg/mL (ref 211–911)

## 2012-04-06 LAB — CBC WITH DIFFERENTIAL/PLATELET
Basophils Absolute: 0 10*3/uL (ref 0.0–0.1)
Eosinophils Relative: 3 % (ref 0–5)
HCT: 33.1 % — ABNORMAL LOW (ref 36.0–46.0)
Hemoglobin: 10.4 g/dL — ABNORMAL LOW (ref 12.0–15.0)
Lymphocytes Relative: 25 % (ref 12–46)
Lymphs Abs: 1.7 10*3/uL (ref 0.7–4.0)
MCV: 75.9 fL — ABNORMAL LOW (ref 78.0–100.0)
Monocytes Absolute: 0.6 10*3/uL (ref 0.1–1.0)
Monocytes Relative: 9 % (ref 3–12)
RDW: 16.6 % — ABNORMAL HIGH (ref 11.5–15.5)
WBC: 6.6 10*3/uL (ref 4.0–10.5)

## 2012-04-06 LAB — FOLATE: Folate: 10 ng/mL

## 2012-04-06 NOTE — Addendum Note (Signed)
Addended by: Ellsworth Lennox on: 04/06/2012 09:09 AM   Modules accepted: Orders

## 2012-04-07 ENCOUNTER — Other Ambulatory Visit: Payer: Self-pay | Admitting: Family Medicine

## 2012-04-07 DIAGNOSIS — M545 Low back pain: Secondary | ICD-10-CM

## 2012-04-23 ENCOUNTER — Ambulatory Visit: Payer: 59 | Attending: Family Medicine | Admitting: Physical Therapy

## 2012-04-23 DIAGNOSIS — M6281 Muscle weakness (generalized): Secondary | ICD-10-CM | POA: Insufficient documentation

## 2012-04-23 DIAGNOSIS — IMO0001 Reserved for inherently not codable concepts without codable children: Secondary | ICD-10-CM | POA: Insufficient documentation

## 2012-04-23 DIAGNOSIS — M2550 Pain in unspecified joint: Secondary | ICD-10-CM | POA: Insufficient documentation

## 2012-04-30 ENCOUNTER — Ambulatory Visit: Payer: 59 | Admitting: Physical Therapy

## 2012-05-07 ENCOUNTER — Ambulatory Visit (INDEPENDENT_AMBULATORY_CARE_PROVIDER_SITE_OTHER): Payer: 59 | Admitting: Family Medicine

## 2012-05-07 ENCOUNTER — Ambulatory Visit: Payer: 59 | Attending: Family Medicine | Admitting: Physical Therapy

## 2012-05-07 ENCOUNTER — Encounter: Payer: Self-pay | Admitting: Family Medicine

## 2012-05-07 VITALS — BP 137/84 | HR 77 | Wt 178.0 lb

## 2012-05-07 DIAGNOSIS — M6281 Muscle weakness (generalized): Secondary | ICD-10-CM | POA: Insufficient documentation

## 2012-05-07 DIAGNOSIS — D509 Iron deficiency anemia, unspecified: Secondary | ICD-10-CM

## 2012-05-07 DIAGNOSIS — IMO0001 Reserved for inherently not codable concepts without codable children: Secondary | ICD-10-CM | POA: Insufficient documentation

## 2012-05-07 DIAGNOSIS — Z1211 Encounter for screening for malignant neoplasm of colon: Secondary | ICD-10-CM

## 2012-05-07 DIAGNOSIS — M545 Low back pain: Secondary | ICD-10-CM

## 2012-05-07 DIAGNOSIS — I10 Essential (primary) hypertension: Secondary | ICD-10-CM

## 2012-05-07 DIAGNOSIS — Z1231 Encounter for screening mammogram for malignant neoplasm of breast: Secondary | ICD-10-CM

## 2012-05-07 DIAGNOSIS — M2550 Pain in unspecified joint: Secondary | ICD-10-CM | POA: Insufficient documentation

## 2012-05-07 DIAGNOSIS — R32 Unspecified urinary incontinence: Secondary | ICD-10-CM

## 2012-05-07 NOTE — Progress Notes (Signed)
  Subjective:    Patient ID: Joy Perez, female    DOB: Nov 27, 1960, 51 y.o.   MRN: [id number]  HPI Low Back Pain - Doing PT and she is getting better.     HTN - no CP or SOB. Taking meds reguarly.  Taking medications regularly.  Iron def anemia. Taking stool softner with the iron.  She says she is finally starting to go feel better. She's been on iron supplement for at least 4 weeks at this point. She said couple days she's had take it twice a day. She's not had any significant constipation with with the stool softener on board.   Review of Systems     Objective:   Physical Exam  Constitutional: She is oriented to person, place, and time. She appears well-developed and well-nourished.  HENT:  Head: Normocephalic and atraumatic.  Cardiovascular: Normal rate, regular rhythm and normal heart sounds.   Pulmonary/Chest: Effort normal and breath sounds normal.  Neurological: She is alert and oriented to person, place, and time.  Skin: Skin is warm and dry.  Psychiatric: She has a normal mood and affect. Her behavior is normal.          Assessment & Plan:  Low back pain - she's getting it proved met with physical therapy. Continue for now.  HTN - well controlled on current regimen and. Continue current medications. Followup in 6 months.  Iron def anemia -3 notice some improvement in her energy level. Continue iron supplement for now. I did go ahead and give her a lab slip today to check her ferritin, reticulocyte count, and hemoglobin in about 4 weeks. We'll then likely she will need to take it for more than 8 weeks since her ferritin was 4 bili so we can make sure that she is improving and responding to the over-the-counter iron tablet. I would also like to get her scheduled for screening colonoscopy. She turns 51 in 4 weeks. We also discussed eating and iron rich diet. She says she's really not much of the meter but did go online to see which foods are iron rich has really been  making some good efforts in that direction.  Will schedule screening mammogram.

## 2012-05-12 ENCOUNTER — Ambulatory Visit: Payer: 59 | Admitting: Physical Therapy

## 2012-05-27 ENCOUNTER — Ambulatory Visit: Payer: 59 | Admitting: Physical Therapy

## 2012-06-01 ENCOUNTER — Other Ambulatory Visit: Payer: Self-pay | Admitting: *Deleted

## 2012-06-01 MED ORDER — DILTIAZEM HCL ER COATED BEADS 300 MG PO CP24: 300.0000 mg | ORAL_CAPSULE | Freq: Every day | ORAL | Status: DC

## 2012-06-18 ENCOUNTER — Other Ambulatory Visit: Payer: Self-pay | Admitting: Family Medicine

## 2012-07-28 ENCOUNTER — Encounter: Payer: Self-pay | Admitting: *Deleted

## 2012-09-25 ENCOUNTER — Other Ambulatory Visit: Payer: Self-pay | Admitting: Family Medicine

## 2012-11-02 ENCOUNTER — Ambulatory Visit (INDEPENDENT_AMBULATORY_CARE_PROVIDER_SITE_OTHER): Payer: 59 | Admitting: Family Medicine

## 2012-11-02 ENCOUNTER — Encounter: Payer: Self-pay | Admitting: Family Medicine

## 2012-11-02 VITALS — BP 121/73 | HR 76 | Wt 180.0 lb

## 2012-11-02 DIAGNOSIS — H6121 Impacted cerumen, right ear: Secondary | ICD-10-CM

## 2012-11-02 DIAGNOSIS — I10 Essential (primary) hypertension: Secondary | ICD-10-CM

## 2012-11-02 DIAGNOSIS — H6120 Impacted cerumen, unspecified ear: Secondary | ICD-10-CM

## 2012-11-02 DIAGNOSIS — Z1239 Encounter for other screening for malignant neoplasm of breast: Secondary | ICD-10-CM

## 2012-11-02 DIAGNOSIS — D509 Iron deficiency anemia, unspecified: Secondary | ICD-10-CM

## 2012-11-02 LAB — CBC
Hemoglobin: 13.6 g/dL (ref 12.0–15.0)
MCHC: 34.2 g/dL (ref 30.0–36.0)
RDW: 14.2 % (ref 11.5–15.5)

## 2012-11-02 LAB — BASIC METABOLIC PANEL WITH GFR
GFR, Est African American: >89 mL/min
GFR, Est Non African American: >89 mL/min
Potassium: 4.3 mEq/L (ref 3.5–5.3)
Sodium: 139 mEq/L (ref 135–145)

## 2012-11-02 MED ORDER — DILTIAZEM HCL ER COATED BEADS 300 MG PO CP24: 300.0000 mg | ORAL_CAPSULE | Freq: Every day | ORAL | Status: DC

## 2012-11-02 MED ORDER — LOSARTAN POTASSIUM 100 MG PO TABS: ORAL_TABLET | ORAL | Status: DC

## 2012-11-02 NOTE — Progress Notes (Signed)
CC: Joy Perez is a 52 y.o. female is here for Hypertension   Subjective: HPI:  Patient presents for refills on blood pressure medications. She reports taking diltiazem and losartan on a daily basis without missed doses. No outside blood pressures to report. Denies headaches, motor or sensory disturbances, chest pain, shortness of breath, decreased exercise tolerance, orthopnea, peripheral edema, nor irregular heartbeat  Patient complains of inability to completely hear out of right ear. This came on gradually over the past few days. It seems to be worse the more she has Murine drops to her right ear.  She denies pain in the right ear, tinnitus, nor roaring sensation. Denies dizziness nor headaches nor motor or sensory disturbances otherwise.  Chart review reveals a history of iron deficiency anemia with hemoglobin of 10.4 last September. She reports taking 3 iron tablets a day for few months and then after significant improvement of fatigue and tremor has only been taking it around the time of her period. She currently denies bleeding or bruising issues, menstruating beyond 3 days, shortness of breath, rapid heartbeat, fatigue, nor tremor.   She wants to know if she should have a mammogram done   Review Of Systems Outlined In HPI  Past Medical History  Diagnosis Date  . Hypertension     hctz- dizziness, toprol- gi upset  . Multinodular goiter 2009    thyroid bx consistent w/ hyperplastic nodules  . Cervical disc disease   . Enlarged thyroid      Family History  Problem Relation Age of Onset  . Leukemia Mother   . Hypertension Father   . Hypothyroidism Father   . Other Father 74    AMI  . Hypertension Sister   . Hypothyroidism Daughter      History  Substance Use Topics  . Smoking status: Never Smoker   . Smokeless tobacco: Not on file  . Alcohol Use: No     Objective: Filed Vitals:   11/02/12 0906  BP: 121/73  Pulse: 76    General: Alert and Oriented, No  Acute Distress HEENT: Pupils equal, round, reactive to light. Conjunctivae clear.  External ears unremarkable, right canal is initially obscured with cerumen impaction, after procedure below 25% of the tympanic membrane is visualized without visualized fluid in middle ear.  Middle ear appears open without effusion. Pink inferior turbinates.  Moist mucous membranes, pharynx without inflammation nor lesions.  Neck supple without palpable lymphadenopathy nor abnormal masses. Lungs: Clear to auscultation bilaterally, no wheezing/ronchi/rales.  Comfortable work of breathing. Good air movement. Cardiac: Regular rate and rhythm. Normal S1/S2.  No murmurs, rubs, nor gallops.   Extremities: No peripheral edema.  Strong peripheral pulses.  Mental Status: No depression, anxiety, nor agitation. Skin: Warm and dry.  Assessment & Plan: Joy Perez was seen today for hypertension.  Diagnoses and associated orders for this visit:  Essential hypertension, benign - BASIC METABOLIC PANEL WITH GFR - Discontinue: diltiazem (CARDIZEM CD) 300 MG 24 hr capsule; Take 1 capsule (300 mg total) by mouth daily. - diltiazem (CARDIZEM CD) 300 MG 24 hr capsule; Take 1 capsule (300 mg total) by mouth daily. - losartan (COZAAR) 100 MG tablet; TAKE 1 TABLET DAILY  Iron deficiency anemia - CBC  Screening for breast cancer - MM Digital Screening; Future  Cerumen impaction, right     Cerumen was removed using gentle irrigation and by myself using a curette. Tympanic membranes are intact following the procedure.  Auditory canals are normal with a mild amount of remaining  cerumen which was left to 2 patient regaining ability to hear out of right ear and discomfort with curette.   Essential hypertension: Controlled, due for renal function and potassium check. Provided no abnormalities we'll continue diltiazem and losartan Iron deficiency anemia: Clinically appears controlled and improved, we'll screen for deterioration with  hemoglobin checked today It has been over a year since her last mammogram and she is over the age of 40 therefore encouraged her to have a annual screening mammogram     Return in about 3 months (around 02/01/2013).

## 2013-04-11 ENCOUNTER — Encounter: Payer: Self-pay | Admitting: Family Medicine

## 2013-04-11 DIAGNOSIS — C50919 Malignant neoplasm of unspecified site of unspecified female breast: Secondary | ICD-10-CM | POA: Insufficient documentation

## 2013-04-12 ENCOUNTER — Ambulatory Visit (INDEPENDENT_AMBULATORY_CARE_PROVIDER_SITE_OTHER): Payer: 59 | Admitting: Family Medicine

## 2013-04-12 DIAGNOSIS — Z23 Encounter for immunization: Secondary | ICD-10-CM

## 2013-04-13 NOTE — Progress Notes (Signed)
  Subjective:    Patient ID: Joy Perez, female    DOB: Nov 20, 1960, 52 y.o.   MRN: [id number]  HPI  Here for flu shot  Review of Systems     Objective:   Physical Exam        Assessment & Plan:

## 2013-10-12 ENCOUNTER — Other Ambulatory Visit: Payer: Self-pay | Admitting: Family Medicine

## 2013-10-25 ENCOUNTER — Other Ambulatory Visit: Payer: Self-pay | Admitting: Family Medicine

## 2013-11-21 ENCOUNTER — Encounter: Payer: Self-pay | Admitting: Physician Assistant

## 2013-11-21 ENCOUNTER — Ambulatory Visit (INDEPENDENT_AMBULATORY_CARE_PROVIDER_SITE_OTHER): Payer: 59 | Admitting: Physician Assistant

## 2013-11-21 VITALS — BP 166/92 | HR 87 | Ht 63.3 in | Wt 182.0 lb

## 2013-11-21 DIAGNOSIS — IMO0002 Reserved for concepts with insufficient information to code with codable children: Secondary | ICD-10-CM

## 2013-11-21 DIAGNOSIS — I10 Essential (primary) hypertension: Secondary | ICD-10-CM

## 2013-11-21 MED ORDER — LOSARTAN POTASSIUM 100 MG PO TABS: ORAL_TABLET | ORAL | Status: DC

## 2013-11-21 MED ORDER — DILTIAZEM HCL ER COATED BEADS 300 MG PO CP24: ORAL_CAPSULE | ORAL | Status: DC

## 2013-11-21 NOTE — Patient Instructions (Signed)
Paronychia Paronychia is an inflammatory reaction involving the folds of the skin surrounding the fingernail. This is commonly caused by an infection in the skin around a nail. The most common cause of paronychia is frequent wetting of the hands (as seen with bartenders, food servers, nurses or others who wet their hands). This makes the skin around the fingernail susceptible to infection by bacteria (germs) or fungus. Other predisposing factors are:  Aggressive manicuring.  Nail biting.  Thumb sucking. The most common cause is a staphylococcal (a type of germ) infection, or a fungal (Candida) infection. When caused by a germ, it usually comes on suddenly with redness, swelling, pus and is often painful. It may get under the nail and form an abscess (collection of pus), or form an abscess around the nail. If the nail itself is infected with a fungus, the treatment is usually prolonged and may require oral medicine for up to one year. Your caregiver will determine the length of time treatment is required. The paronychia caused by bacteria (germs) may largely be avoided by not pulling on hangnails or picking at cuticles. When the infection occurs at the tips of the finger it is called felon. When the cause of paronychia is from the herpes simplex virus (HSV) it is called herpetic whitlow. TREATMENT  When an abscess is present treatment is often incision and drainage. This means that the abscess must be cut open so the pus can get out. When this is done, the following home care instructions should be followed. HOME CARE INSTRUCTIONS   It is important to keep the affected fingers very dry. Rubber or plastic gloves over cotton gloves should be used whenever the hand must be placed in water.  Keep wound clean, dry and dressed as suggested by your caregiver between warm soaks or warm compresses.  Soak in warm water for fifteen to twenty minutes three to four times per day for bacterial infections. Fungal  infections are very difficult to treat, so often require treatment for long periods of time.  For bacterial (germ) infections take antibiotics (medicine which kill germs) as directed and finish the prescription, even if the problem appears to be solved before the medicine is gone.  Only take over-the-counter or prescription medicines for pain, discomfort, or fever as directed by your caregiver. SEEK IMMEDIATE MEDICAL CARE IF:  You have redness, swelling, or increasing pain in the wound.  You notice pus coming from the wound.  You have a fever.  You notice a bad smell coming from the wound or dressing. Document Released: 12/17/2000 Document Revised: 09/15/2011 Document Reviewed: 08/18/2008 [employer] Patient Information 2014 [employer].  

## 2013-11-21 NOTE — Progress Notes (Signed)
   Subjective:    Patient ID: Joy Perez, female    DOB: 1961-02-02, 53 y.o.   MRN: [id number]  HPI Patient is a 53 year old female who presents to the clinic with great blood toe pain. The pain is not on the medial or lateral side but along the cuticle line. She's never had anything like this before on her feet however she has had similar female reactions on her hands while taking chemotherapy. She had her last dose of chemotherapy in January 2015. 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 She does not think this is a medication reaction. She has noticed it for the last 2 days. Pain and inflammation seemed to be worsening. Not taking anything by mouth. She has started warm water soaks which seem to be helping.  Hypertension-patient needs refill on her blood pressure medications. She admits to not taking medication to about 15 minutes before this appointment. She denies any chest pain, palpitations, headaches, vision changes. She has checked her blood pressure and other office visits and has been normal when taking blood pressure medication.    Review of Systems  All other systems reviewed and are negative.      Objective:   Physical Exam  Constitutional: She is oriented to person, place, and time. She appears well-developed and well-nourished.  HENT:  Head: Normocephalic and atraumatic.  Cardiovascular: Normal rate, regular rhythm and normal heart sounds.   Pulmonary/Chest: Effort normal and breath sounds normal. She has no wheezes.  Musculoskeletal:       Feet:  Neurological: She is alert and oriented to person, place, and time.  Psychiatric: She has a normal mood and affect. Her behavior is normal.          Assessment & Plan:  Paronychia-discuss with patient I do not think this is an ingrown toenail because not affecting the nail fold sides. Does seem like it is infected. I did start patient on doxycycline 100 mg twice a day for 10 days. Doxycycline was  called into the pharmacy because system was down during visit. Continue warm water soaks. Followup if not improving or if symptoms worsening. I did not see that there was any over abscess that needed to be incised and drained   Hypertension-blood pressure is not controlled today however patient has just taken her blood pressure medication and likely has not had time to work. She does have some good readings at other office visits. Discuss with patient to followup with Dr. Charise Carwin in one month for blood pressure recheck. Will refill medication for one month until she can get in with Dr. Charise Carwin. Encouraged patient to keep down her blood pressure ranging above 150/92 please come in sooner.

## 2013-11-30 ENCOUNTER — Encounter: Payer: Self-pay | Admitting: Family Medicine

## 2013-11-30 ENCOUNTER — Ambulatory Visit (INDEPENDENT_AMBULATORY_CARE_PROVIDER_SITE_OTHER): Payer: 59 | Admitting: Family Medicine

## 2013-11-30 VITALS — BP 154/82 | HR 80 | Wt 183.0 lb

## 2013-11-30 DIAGNOSIS — H6120 Impacted cerumen, unspecified ear: Secondary | ICD-10-CM

## 2013-11-30 DIAGNOSIS — H918X9 Other specified hearing loss, unspecified ear: Secondary | ICD-10-CM

## 2013-11-30 NOTE — Progress Notes (Signed)
CC: Joy Perez is a 53 y.o. female is here for left ear feels full   Subjective: HPI:  Complains of decreased hearing in left ear that came on abruptly overnight symptoms were absent yesterday however severe this morning.  Interventions have included debrox without much benefit. Her oncologist within the last few weeks told her that her left ear canal looked like it was getting full of wax, there been no other interventions. Symptoms are questionably mildly present on the right as well. She denies any recent barotrauma, damage to the ear, nor foreign objects placed in either years. She denies tinnitus, ear pain, drainage from the ear, headache, nasal congestion, nor motor or sensory disturbances other than that described above   Review Of Systems Outlined In HPI  Past Medical History  Diagnosis Date  . Hypertension     hctz- dizziness, toprol- gi upset  . Multinodular goiter 2009    thyroid bx consistent w/ hyperplastic nodules  . Cervical disc disease   . Enlarged thyroid     Past Surgical History  Procedure Laterality Date  . Cholecystectomy     Family History  Problem Relation Age of Onset  . Leukemia Mother   . Hypertension Father   . Hypothyroidism Father   . Other Father 67    AMI  . Hypertension Sister   . Hypothyroidism Daughter     History   Social History  . Marital Status: Married    Spouse Name: N/A    Number of Children: N/A  . Years of Education: N/A   Occupational History  . Not on file.   Social History Main Topics  . Smoking status: Never Smoker   . Smokeless tobacco: Not on file  . Alcohol Use: No  . Drug Use:   . Sexual Activity:    Other Topics Concern  . Not on file   Social History Narrative  . No narrative on file     Objective: BP 154/82  Pulse 80  Wt 183 lb (83.008 kg)  General: Alert and Oriented, No Acute Distress HEENT: Pupils equal, round, reactive to light. Conjunctivae clear.  External ears unremarkable. On initial  exam there is a cerumen impaction on the left ear canal which appears much more dense than another impaction on the right ear canal.  Following irritation with water and hydrogen peroxide there remains a moderate amount of cerumen however it does not appear impacted and 25% of both tympanic membranes are visible. Neuro: Cranial nerves 2-12 grossly intact Extremities: No peripheral edema.  Strong peripheral pulses.  Mental Status: No depression, anxiety, nor agitation. Skin: Warm and dry.  Assessment & Plan: [name] was seen today for left ear feels full.  Diagnoses and associated orders for this visit:  Hearing loss secondary to cerumen impaction    Indication: Cerumen impaction of the ear(s) Medical necessity statement: On physical examination, cerumen impairs clinically significant portions of the external auditory canal, and tympanic membrane. Noted obstructive, copious cerumen that cannot be removed without magnification and instrumentations requiring physician skills Consent: Discussed benefits and risks of procedure and verbal consent obtained Procedure: Patient was prepped for the procedure. Utilized an otoscope to assess and take note of the ear canal, the tympanic membrane, and the presence, amount, and placement of the cerumen. Gentle water irrigation and soft plastic curette was utilized to remove cerumen.  Post procedure examination: shows cerumen was  Removed to regain hearing loss. Patient had difficulty with pain with urination which required termination of  irrigation before all cerumen could be removed.. The patient is made aware that they may experience temporary vertigo, temporary hearing loss, and temporary discomfort. If these symptom last for more than 24 hours to call the clinic or proceed to the ED.   Encouraged to use hydrogen peroxide and water 50-50 ratio and applied both ear canals a daily basis for the next week to help continue diluting cerumen and return if needed next  week.  Return if symptoms worsen or fail to improve.

## 2014-01-26 IMAGING — CR DG LUMBAR SPINE 2-3V
3 series · 3 of 3 positions shown · non-contrast
Comparison: None.

CLINICAL DATA: Low back pain, right anterior thigh numbness, no
injury

LUMBAR SPINE - 2-3 VIEW

[view not recorded (1 of 3)]
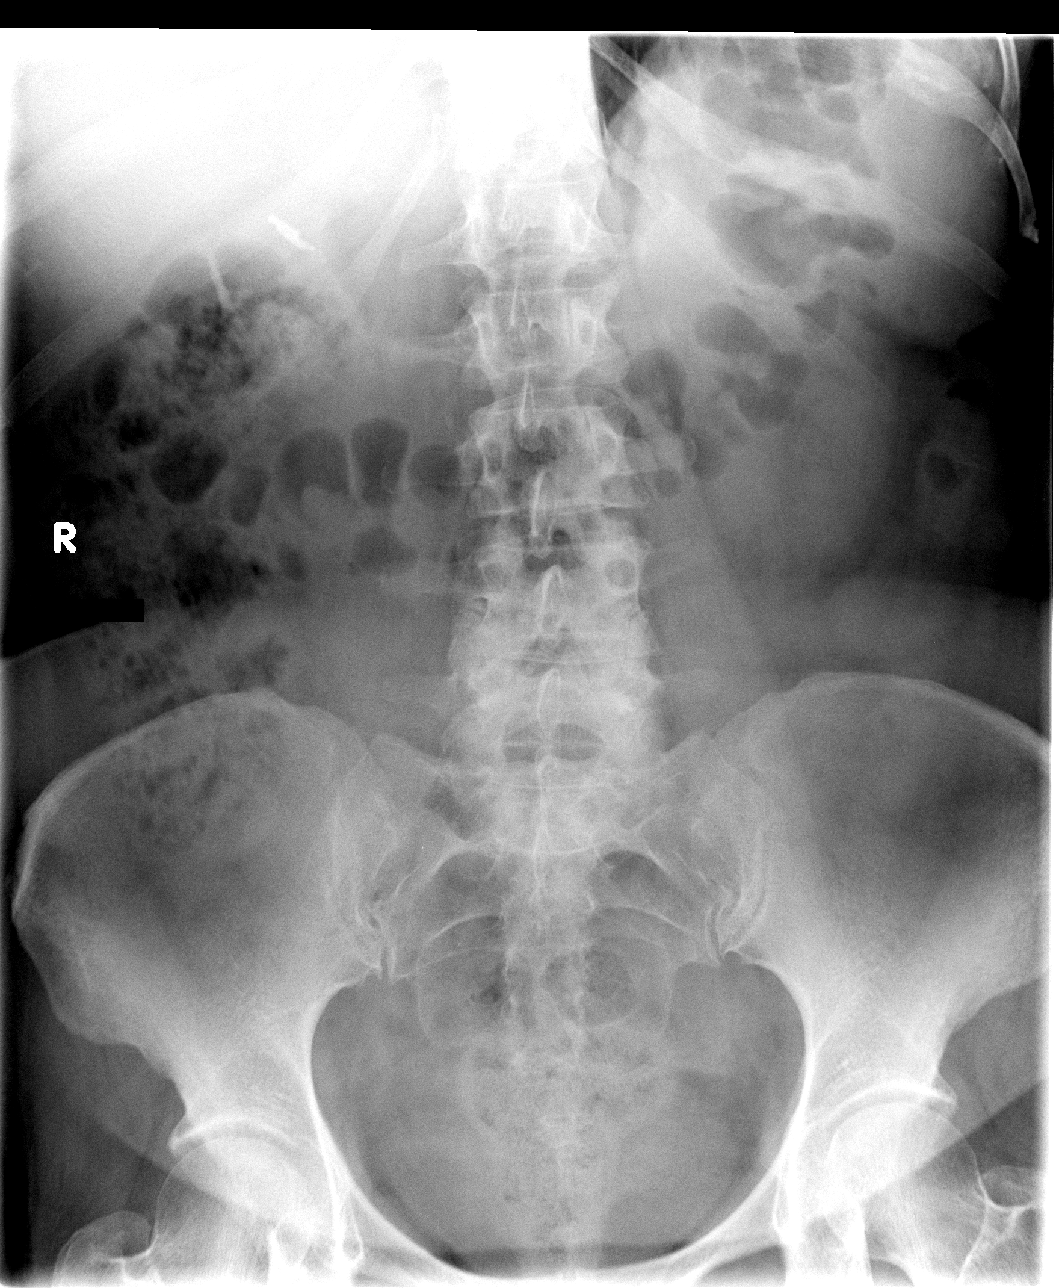

[view not recorded (2 of 3)]
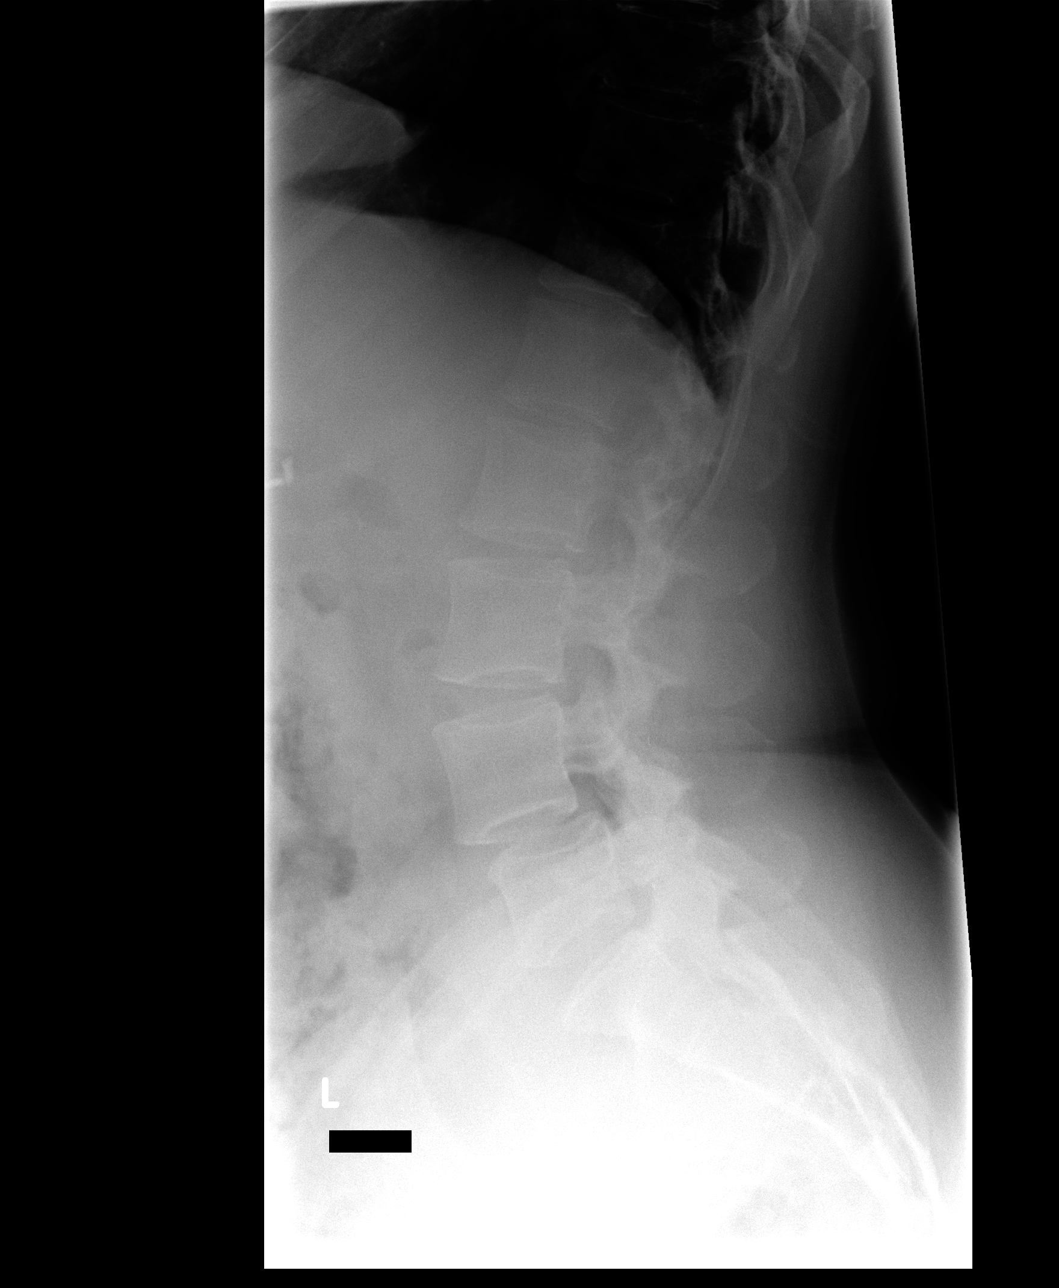

[view not recorded (3 of 3)]
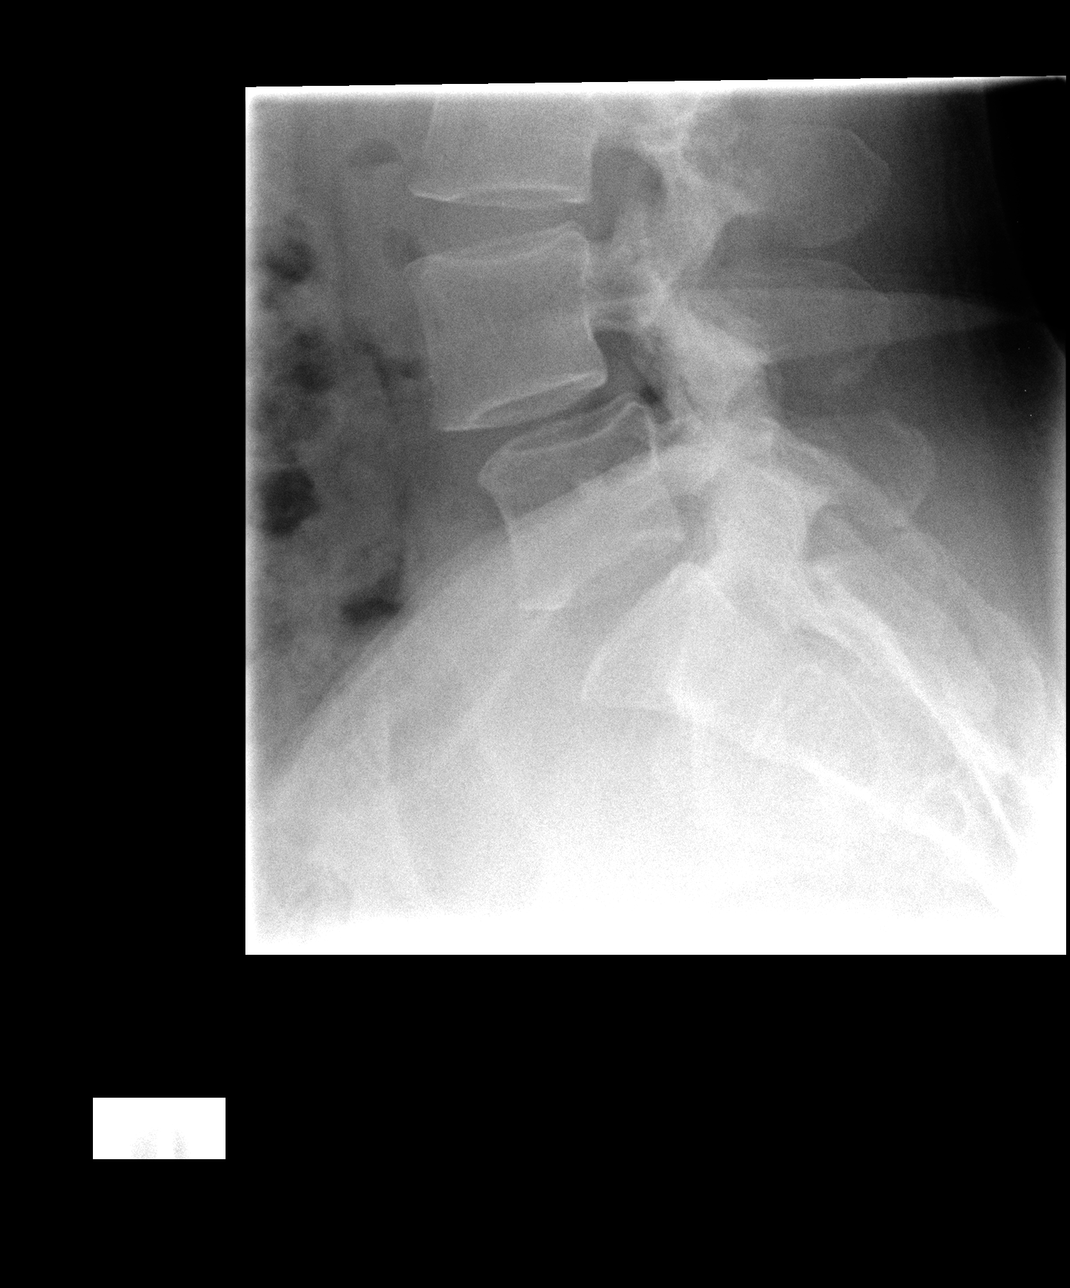

[3 of 3 positions shown; findings below may reference images not displayed]

FINDINGS: There is mild degenerative disc disease at L4-5 where
there is a 6 mm anterolisthesis of L4 on L5.  This slight slippage
appears to be due to degenerative change involving the facet joints
of L4-5.  No compression deformity is seen.  The remainder of the
intervertebral disc spaces appear normal.  The SI joints are
corticated.
IMPRESSION: Mild degenerative disc disease at L4-5 with a 6 mm
anterolisthesis of L4 on L5, most likely degenerative in origin.

## 2014-01-27 ENCOUNTER — Other Ambulatory Visit: Payer: Self-pay | Admitting: Family Medicine

## 2014-03-15 ENCOUNTER — Encounter: Payer: Self-pay | Admitting: Family Medicine

## 2014-03-19 ENCOUNTER — Other Ambulatory Visit: Payer: Self-pay | Admitting: Physician Assistant

## 2014-03-22 ENCOUNTER — Encounter: Payer: Self-pay | Admitting: Family Medicine

## 2014-03-22 ENCOUNTER — Ambulatory Visit (INDEPENDENT_AMBULATORY_CARE_PROVIDER_SITE_OTHER): Payer: 59 | Admitting: Family Medicine

## 2014-03-22 VITALS — BP 142/74 | HR 72 | Ht 63.3 in | Wt 191.0 lb

## 2014-03-22 DIAGNOSIS — H6120 Impacted cerumen, unspecified ear: Secondary | ICD-10-CM

## 2014-03-22 DIAGNOSIS — Z23 Encounter for immunization: Secondary | ICD-10-CM

## 2014-03-22 DIAGNOSIS — D509 Iron deficiency anemia, unspecified: Secondary | ICD-10-CM

## 2014-03-22 DIAGNOSIS — I10 Essential (primary) hypertension: Secondary | ICD-10-CM

## 2014-03-22 DIAGNOSIS — H6123 Impacted cerumen, bilateral: Secondary | ICD-10-CM

## 2014-03-22 MED ORDER — LOSARTAN POTASSIUM-HCTZ 100-25 MG PO TABS: 1.0000 | ORAL_TABLET | Freq: Every day | ORAL | Status: DC

## 2014-03-22 NOTE — Assessment & Plan Note (Signed)
Due to recheck her iron, ferritin, TIBC

## 2014-03-22 NOTE — Progress Notes (Signed)
   Subjective:    Patient ID: Joy Perez, female    DOB: [birth year], 53 y.o.   MRN: [id number]  Hypertension   Hypertension- she's here today because she says the last 2 times she has been at the doctor's office her blood pressures have been elevated. She denies any shortness of breath, lightheadedness, or heart palpitations.  Taking meds as directed w/o problems.  Denies medication side effects.  Her BP has been creeping up as her weight has been going up.  She is doing treatments for her breast cancer. She has started walking.  Has been hearing her heartbeat in her left ear recently.   Iron def anemia - last seen in 3013 for iron def anemia. Was started on iron. But never followed back up for this. She was dx with BrCa and has been followed by Dr. Verdell Carmine so has not been in our office and while.  Review of Systems     Objective:   Physical Exam  Constitutional: She is oriented to person, place, and time. She appears well-developed and well-nourished.  HENT:  Head: Normocephalic and atraumatic.  Cardiovascular: Normal rate, regular rhythm and normal heart sounds.   Pulmonary/Chest: Effort normal and breath sounds normal.  Neurological: She is alert and oriented to person, place, and time.  Skin: Skin is warm and dry.  Psychiatric: She has a normal mood and affect. Her behavior is normal.          Assessment & Plan:  Bilateral cerumen impaction-irrigation performed and patient tolerated well.

## 2014-03-22 NOTE — Assessment & Plan Note (Addendum)
Uncontrolled. Will add HCT 25mg t daily.  F/U in 3-4 weeks to recheck BP.  We'll need to recheck BMP at that time.

## 2014-03-27 ENCOUNTER — Encounter: Payer: Self-pay | Admitting: Family Medicine

## 2014-04-08 LAB — COMPLETE METABOLIC PANEL WITH GFR
ALT: 17 U/L (ref 0–35)
AST: 14 U/L (ref 0–37)
Albumin: 4.2 g/dL (ref 3.5–5.2)
Alkaline Phosphatase: 98 U/L (ref 39–117)
BUN: 19 mg/dL (ref 6–23)
CO2: 31 mEq/L (ref 19–32)
Calcium: 9.3 mg/dL (ref 8.4–10.5)
Chloride: 98 mEq/L (ref 96–112)
Creat: 0.7 mg/dL (ref 0.50–1.10)
GFR, Est African American: >89 mL/min
GFR, Est Non African American: >89 mL/min
Glucose, Bld: 108 mg/dL — ABNORMAL HIGH (ref 70–99)
Potassium: 3.3 mEq/L — ABNORMAL LOW (ref 3.5–5.3)
Sodium: 140 mEq/L (ref 135–145)
TOTAL PROTEIN: 7 g/dL (ref 6.0–8.3)
Total Bilirubin: 0.4 mg/dL (ref 0.2–1.2)

## 2014-04-08 LAB — IRON AND TIBC
%SAT: 16 % — AB (ref 20–55)
Iron: 47 ug/dL (ref 42–145)
TIBC: 293 ug/dL (ref 250–470)
UIBC: 246 ug/dL (ref 125–400)

## 2014-04-08 LAB — LIPID PANEL
CHOL/HDL RATIO: 3.9 ratio
Cholesterol: 181 mg/dL (ref 0–200)
HDL: 47 mg/dL (ref >39)
LDL CALC: 110 mg/dL — AB (ref 0–99)
Triglycerides: 121 mg/dL (ref <150)
VLDL: 24 mg/dL (ref 0–40)

## 2014-04-08 LAB — FERRITIN: Ferritin: 119 ng/mL (ref 10–291)

## 2014-04-10 ENCOUNTER — Ambulatory Visit (INDEPENDENT_AMBULATORY_CARE_PROVIDER_SITE_OTHER): Payer: 59 | Admitting: Family Medicine

## 2014-04-10 ENCOUNTER — Encounter: Payer: Self-pay | Admitting: Family Medicine

## 2014-04-10 VITALS — BP 136/86 | HR 72 | Wt 191.0 lb

## 2014-04-10 DIAGNOSIS — E049 Nontoxic goiter, unspecified: Secondary | ICD-10-CM

## 2014-04-10 DIAGNOSIS — I10 Essential (primary) hypertension: Secondary | ICD-10-CM

## 2014-04-10 DIAGNOSIS — E876 Hypokalemia: Secondary | ICD-10-CM

## 2014-04-10 NOTE — Patient Instructions (Signed)
Potassium Content of Foods  Potassium is a mineral found in many foods and drinks. It helps keep fluids and minerals balanced in your body and affects how steadily your heart beats. Potassium also helps control your blood pressure and keep your muscles and nervous system healthy.  Certain health conditions and medicines may change the balance of potassium in your body. When this happens, you can help balance your level of potassium through the foods that you do or do not eat. Your health care provider or dietitian may recommend an amount of potassium that you should have each day. The following lists of foods provide the amount of potassium (in parentheses) per serving in each item.  HIGH IN POTASSIUM   The following foods and beverages have 200 mg or more of potassium per serving:  · Apricots, 2 raw or 5 dry (200 mg).  · Artichoke, 1 medium (345 mg).  · Avocado, raw,  ¼ each (245 mg).  · Banana, 1 medium (425 mg).  · Beans, lima, or baked beans, canned, ½ cup (280 mg).  · Beans, white, canned, ½ cup (595 mg).  · Beef roast, 3 oz (320 mg).  · Beef, ground, 3 oz (270 mg).  · Beets, raw or cooked, ½ cup (260 mg).  · Bran muffin, 2 oz (300 mg).  · Broccoli, ½ cup (230 mg).  · Brussels sprouts, ½ cup (250 mg).  · Cantaloupe, ½ cup (215 mg).  · Cereal, 100% bran, ½ cup (200-400 mg).  · Cheeseburger, single, fast food, 1 each (225-400 mg).  · Chicken, 3 oz (220 mg).  · Clams, canned, 3 oz (535 mg).  · Crab, 3 oz (225 mg).  · Dates, 5 each (270 mg).  · Dried beans and peas, ½ cup (300-475 mg).  · Figs, dried, 2 each (260 mg).  · Fish: halibut, tuna, cod, snapper, 3 oz (480 mg).  · Fish: salmon, haddock, swordfish, perch, 3 oz (300 mg).  · Fish, tuna, canned 3 oz (200 mg).  · French fries, fast food, 3 oz (470 mg).  · Granola with fruit and nuts, ½ cup (200 mg).  · Grapefruit juice, ½ cup (200 mg).  · Greens, beet, ½ cup (655 mg).  · Honeydew melon, ½ cup (200 mg).  · Kale, raw, 1 cup (300 mg).  · Kiwi, 1 medium (240  mg).  · Kohlrabi, rutabaga, parsnips, ½ cup (280 mg).  · Lentils, ½ cup (365 mg).  · Mango, 1 each (325 mg).  · Milk, chocolate, 1 cup (420 mg).  · Milk: nonfat, low-fat, whole, buttermilk, 1 cup (350-380 mg).  · Molasses, 1 Tbsp (295 mg).  · Mushrooms, ½ cup (280) mg.  · Nectarine, 1 each (275 mg).  · Nuts: almonds, peanuts, hazelnuts, [place], cashew, mixed, 1 oz (200 mg).  · Nuts, pistachios, 1 oz (295 mg).  · Orange, 1 each (240 mg).  · Orange juice, ½ cup (235 mg).  · Papaya, medium, ½ fruit (390 mg).  · Peanut butter, chunky, 2 Tbsp (240 mg).  · Peanut butter, smooth, 2 Tbsp (210 mg).  · Pear, 1 medium (200 mg).  · Pomegranate, 1 whole (400 mg).  · Pomegranate juice, ½ cup (215 mg).  · Pork, 3 oz (350 mg).  · Potato chips, salted, 1 oz (465 mg).  · Potato, baked with skin, 1 medium (925 mg).  · Potatoes, boiled, ½ cup (255 mg).  · Potatoes, mashed, ½ cup (330 mg).  · Prune juice, ½ cup (  370 mg).  · Prunes, 5 each (305 mg).  · Pudding, chocolate, ½ cup (230 mg).  · Pumpkin, canned, ½ cup (250 mg).  · Raisins, seedless, ¼ cup (270 mg).  · Seeds, sunflower or pumpkin, 1 oz (240 mg).  · Soy milk, 1 cup (300 mg).  · Spinach, ½ cup (420 mg).  · Spinach, canned, ½ cup (370 mg).  · Sweet potato, baked with skin, 1 medium (450 mg).  · Swiss chard, ½ cup (480 mg).  · Tomato or vegetable juice, ½ cup (275 mg).  · Tomato sauce or puree, ½ cup (400-550 mg).  · Tomato, raw, 1 medium (290 mg).  · Tomatoes, canned, ½ cup (200-300 mg).  · Turkey, 3 oz (250 mg).  · Wheat germ, 1 oz (250 mg).  · Winter squash, ½ cup (250 mg).  · Yogurt, plain or fruited, 6 oz (260-435 mg).  · Zucchini, ½ cup (220 mg).  MODERATE IN POTASSIUM  The following foods and beverages have 50-200 mg of potassium per serving:  · Apple, 1 each (150 mg).  · Apple juice, ½ cup (150 mg).  · Applesauce, ½ cup (90 mg).  · Apricot nectar, ½ cup (140 mg).  · Asparagus, small spears, ½ cup or 6 spears (155 mg).  · Bagel, cinnamon raisin, 1 each (130 mg).  · Bagel,  egg or plain, 4 in., 1 each (70 mg).  · Beans, green, ½ cup (90 mg).  · Beans, yellow, ½ cup (190 mg).  · Beer, regular, 12 oz (100 mg).  · Beets, canned, ½ cup (125 mg).  · Blackberries, ½ cup (115 mg).  · Blueberries, ½ cup (60 mg).  · Bread, whole wheat, 1 slice (70 mg).  · Broccoli, raw, ½ cup (145 mg).  · Cabbage, ½ cup (150 mg).  · Carrots, cooked or raw, ½ cup (180 mg).  · Cauliflower, raw, ½ cup (150 mg).  · Celery, raw, ½ cup (155 mg).  · Cereal, bran flakes, ½cup (120-150 mg).  · Cheese, cottage, ½ cup (110 mg).  · Cherries, 10 each (150 mg).  · Chocolate, 1½ oz bar (165 mg).  · Coffee, brewed 6 oz (90 mg).  · Corn, ½ cup or 1 ear (195 mg).  · Cucumbers, ½ cup (80 mg).  · Egg, large, 1 each (60 mg).  · Eggplant, ½ cup (60 mg).  · Endive, raw, ½cup (80 mg).  · English muffin, 1 each (65 mg).  · Fish, orange roughy, 3 oz (150 mg).  · Frankfurter, beef or pork, 1 each (75 mg).  · Fruit cocktail, ½ cup (115 mg).  · Grape juice, ½ cup (170 mg).  · Grapefruit, ½ fruit (175 mg).  · Grapes, ½ cup (155 mg).  · Greens: kale, turnip, collard, ½ cup (110-150 mg).  · Ice cream or frozen yogurt, chocolate, ½ cup (175 mg).  · Ice cream or frozen yogurt, vanilla, ½ cup (120-150 mg).  · Lemons, limes, 1 each (80 mg).  · Lettuce, all types, 1 cup (100 mg).  · Mixed vegetables, ½ cup (150 mg).  · Mushrooms, raw, ½ cup (110 mg).  · Nuts: walnuts, pecans, or macadamia, 1 oz (125 mg).  · Oatmeal, ½ cup (80 mg).  · Okra, ½ cup (110 mg).  · Onions, raw, ½ cup (120 mg).  · Peach, 1 each (185 mg).  · Peaches, canned, ½ cup (120 mg).  · Pears, canned, ½ cup (120 mg).  · Peas, green,   frozen, ½ cup (90 mg).  · Peppers, green, ½ cup (130 mg).  · Peppers, red, ½ cup (160 mg).  · Pineapple juice, ½ cup (165 mg).  · Pineapple, fresh or canned, ½ cup (100 mg).  · Plums, 1 each (105 mg).  · Pudding, vanilla, ½ cup (150 mg).  · Raspberries, ½ cup (90 mg).  · Rhubarb, ½ cup (115 mg).  · Rice, wild, ½ cup (80 mg).  · Shrimp, 3 oz (155  mg).  · Spinach, raw, 1 cup (170 mg).  · Strawberries, ½ cup (125 mg).  · Summer squash ½ cup (175-200 mg).  · Swiss chard, raw, 1 cup (135 mg).  · Tangerines, 1 each (140 mg).  · Tea, brewed, 6 oz (65 mg).  · Turnips, ½ cup (140 mg).  · Watermelon, ½ cup (85 mg).  · Wine, red, table, 5 oz (180 mg).  · Wine, white, table, 5 oz (100 mg).  LOW IN POTASSIUM  The following foods and beverages have less than 50 mg of potassium per serving.  · Bread, white, 1 slice (30 mg).  · Carbonated beverages, 12 oz (less than 5 mg).  · Cheese, 1 oz (20-30 mg).  · Cranberries, ½ cup (45 mg).  · Cranberry juice cocktail, ½ cup (20 mg).  · Fats and oils, 1 Tbsp (less than 5 mg).  · Hummus, 1 Tbsp (32 mg).  · Nectar: papaya, mango, or pear, ½ cup (35 mg).  · Rice, white or brown, ½ cup (50 mg).  · Spaghetti or macaroni, ½ cup cooked (30 mg).  · Tortilla, flour or corn, 1 each (50 mg).  · Waffle, 4 in., 1 each (50 mg).  · Water chestnuts, ½ cup (40 mg).  Document Released: 02/04/2005 Document Revised: 06/28/2013 Document Reviewed: 05/20/2013  [employer]® Patient Information ©2015 [employer]. This information is not intended to replace advice given to you by your health care provider. Make sure you discuss any questions you have with your health care provider.

## 2014-04-10 NOTE — Progress Notes (Signed)
   Subjective:    Patient ID: Joy Perez, female    DOB: December 07, 1960, 53 y.o.   MRN: [id number]  Hypertension   Hypertension- Pt denies chest pain, SOB, dizziness, or heart palpitations.  Taking meds as directed w/o problems.  Denies medication side effects.    Hypokalemia-recent labs showed low potassium at we increased her diuretic component. She took her last dose of Herceptin last Friday.  Goiter-she's had a couple questions about whether not it needs to be followed up. She's had a negative biopsy in the past. He denies any changes in the size. Review of Systems     Objective:   Physical Exam  Constitutional: She is oriented to person, place, and time. She appears well-developed and well-nourished.  HENT:  Head: Normocephalic and atraumatic.  Cardiovascular: Normal rate, regular rhythm and normal heart sounds.   Pulmonary/Chest: Effort normal and breath sounds normal.  Neurological: She is alert and oriented to person, place, and time.  Skin: Skin is warm and dry.  Psychiatric: She has a normal mood and affect. Her behavior is normal.          Assessment & Plan:  HtN - well-controlled. Followup in 6 months. If her potassium was a little bit low last week. It could be because we increased her diuretic. Given handout about potassium rich diet. Includes that would help her.  She will recheck her level in one to 2 weeks.  Goiter-benign. We'll recheck TSH since the last was about 2 years ago. She has not noticed any significant changes.  Iron deficiency-repeat iron/ferritin looks fantastic. She's taking her iron once a day. She can now decrease to every other day.

## 2014-04-12 ENCOUNTER — Other Ambulatory Visit: Payer: Self-pay | Admitting: *Deleted

## 2014-04-12 DIAGNOSIS — E876 Hypokalemia: Secondary | ICD-10-CM

## 2014-04-19 ENCOUNTER — Other Ambulatory Visit: Payer: Self-pay | Admitting: *Deleted

## 2014-04-19 ENCOUNTER — Other Ambulatory Visit: Payer: Self-pay | Admitting: Family Medicine

## 2014-04-19 MED ORDER — LOSARTAN POTASSIUM-HCTZ 100-25 MG PO TABS
ORAL_TABLET | ORAL | Status: DC
Start: 2014-04-19 — End: 2014-04-20

## 2014-04-20 ENCOUNTER — Other Ambulatory Visit: Payer: Self-pay | Admitting: *Deleted

## 2014-04-20 MED ORDER — LOSARTAN POTASSIUM-HCTZ 100-25 MG PO TABS: ORAL_TABLET | ORAL | Status: DC

## 2014-05-01 ENCOUNTER — Ambulatory Visit: Payer: 59 | Admitting: Family Medicine

## 2014-05-09 ENCOUNTER — Ambulatory Visit (INDEPENDENT_AMBULATORY_CARE_PROVIDER_SITE_OTHER): Payer: 59 | Admitting: Family Medicine

## 2014-05-09 ENCOUNTER — Encounter: Payer: Self-pay | Admitting: Family Medicine

## 2014-05-09 VITALS — BP 128/77 | HR 84 | Ht 64.0 in | Wt 189.0 lb

## 2014-05-09 DIAGNOSIS — E876 Hypokalemia: Secondary | ICD-10-CM

## 2014-05-09 DIAGNOSIS — I10 Essential (primary) hypertension: Secondary | ICD-10-CM

## 2014-05-09 LAB — BASIC METABOLIC PANEL WITH GFR
BUN: 14 mg/dL (ref 6–23)
CO2: 24 mEq/L (ref 19–32)
Calcium: 9.5 mg/dL (ref 8.4–10.5)
Chloride: 98 mEq/L (ref 96–112)
Creat: 0.65 mg/dL (ref 0.50–1.10)
GFR, Est African American: >89 mL/min
GFR, Est Non African American: >89 mL/min
Glucose, Bld: 96 mg/dL (ref 70–99)
Potassium: 3.7 mEq/L (ref 3.5–5.3)
Sodium: 137 mEq/L (ref 135–145)

## 2014-05-09 MED ORDER — LOSARTAN POTASSIUM 100 MG PO TABS: 100.0000 mg | ORAL_TABLET | Freq: Every day | ORAL | Status: DC

## 2014-05-09 MED ORDER — AMLODIPINE BESYLATE 2.5 MG PO TABS: 2.5000 mg | ORAL_TABLET | Freq: Every day | ORAL | Status: DC

## 2014-05-09 NOTE — Progress Notes (Signed)
   Subjective:    Patient ID: Joy Perez, female    DOB: [birth year], 53 y.o.   MRN: [id number]  HPI Hypertension- Here for f/U. We added HCTZ 25mg  to her regime a few weeks ago right before she had her surgery for ovary removal.  About 3 days prior to surgery she felt suddenly weak in her legs when she got out of bed and almost passed out. She slumped to the floor. She went to the ED. Had ful evaluation.  Told her potassium was low. Was started on potassium 89mEq.  She has felt some better since being on the supplement but still feels weak.  Has also felt a little dizzy/off  Balance since starting the medication. Has felt really tired since her surgery.    Review of Systems     Objective:   Physical Exam  Constitutional: She is oriented to person, place, and time. She appears well-developed and well-nourished.  HENT:  Head: Normocephalic and atraumatic.  Cardiovascular: Normal rate, regular rhythm and normal heart sounds.   Pulmonary/Chest: Effort normal and breath sounds normal.  Neurological: She is alert and oriented to person, place, and time.  Skin: Skin is warm and dry.  Psychiatric: She has a normal mood and affect. Her behavior is normal.          Assessment & Plan:  HTN - well controlled. Looks fantastic today.  At this point we will stop the hydrochlorothiazide. We'll recheck her potassium today and if it's normal then she can stop the additional potassium supplement. She's also been feeling dizzy since starting at size think we will add amlodipine to her regimen instead of using hydrochlorothiazide.follow-up in one month to make sure the blood pressures well controlled on new regimen and to make sure that her symptoms are improving.  Hypokalemia-recheck potassium today. If normal then she can stop the potassium supplement since I'm going to discontinue her diuretic. If it's still a little bit low then I will have her continue it and then recheck her again in about 2  weeks.

## 2014-05-10 NOTE — Progress Notes (Signed)
Quick Note:  All labs are normal. ______ 

## 2014-06-08 ENCOUNTER — Other Ambulatory Visit: Payer: Self-pay | Admitting: Family Medicine

## 2014-06-09 ENCOUNTER — Encounter: Payer: Self-pay | Admitting: Family Medicine

## 2014-06-09 ENCOUNTER — Ambulatory Visit (INDEPENDENT_AMBULATORY_CARE_PROVIDER_SITE_OTHER): Payer: 59 | Admitting: Family Medicine

## 2014-06-09 VITALS — BP 125/78 | HR 79 | Wt 193.0 lb

## 2014-06-09 DIAGNOSIS — E876 Hypokalemia: Secondary | ICD-10-CM

## 2014-06-09 DIAGNOSIS — I10 Essential (primary) hypertension: Secondary | ICD-10-CM

## 2014-06-09 MED ORDER — AMLODIPINE BESYLATE 2.5 MG PO TABS: 2.5000 mg | ORAL_TABLET | Freq: Every day | ORAL | Status: DC

## 2014-06-09 MED ORDER — LOSARTAN POTASSIUM 100 MG PO TABS: ORAL_TABLET | ORAL | Status: DC

## 2014-06-09 NOTE — Progress Notes (Signed)
   Subjective:    Patient ID: Joy Perez, female    DOB: Feb 01, 1961, 53 y.o.   MRN: [id number]  HPI Hypertension- Pt denies chest pain, SOB, dizziness, or heart palpitations.  Taking meds as directed w/o problems.  Denies medication side effects.  She feels much better off the HCTZ.    Hypokalemia  - likley from the hctz. She is off it now and she is feeling better.  Added to intolerance list. Repeat k+ looked good.    Review of Systems     Objective:   Physical Exam  Constitutional: She is oriented to person, place, and time. She appears well-developed and well-nourished.  HENT:  Head: Normocephalic and atraumatic.  Cardiovascular: Normal rate, regular rhythm and normal heart sounds.   Pulmonary/Chest: Effort normal and breath sounds normal.  Neurological: She is alert and oriented to person, place, and time.  Skin: Skin is warm and dry.  Psychiatric: She has a normal mood and affect. Her behavior is normal.          Assessment & Plan:  HTN - well controlled.  Continue current regimen. Follow-up in 6 months.  RF sent to mail order.   Hypokalemia-repeat was back to normal off of the [place] chlorothiazide.

## 2014-06-16 ENCOUNTER — Other Ambulatory Visit: Payer: Self-pay | Admitting: Physician Assistant

## 2014-06-26 ENCOUNTER — Encounter: Payer: Self-pay | Admitting: Physician Assistant

## 2014-06-26 ENCOUNTER — Ambulatory Visit (INDEPENDENT_AMBULATORY_CARE_PROVIDER_SITE_OTHER): Payer: 59 | Admitting: Physician Assistant

## 2014-06-26 VITALS — BP 146/85 | HR 91 | Temp 97.8°F | Ht 64.0 in | Wt 190.0 lb

## 2014-06-26 DIAGNOSIS — J329 Chronic sinusitis, unspecified: Secondary | ICD-10-CM

## 2014-06-26 DIAGNOSIS — R05 Cough: Secondary | ICD-10-CM

## 2014-06-26 DIAGNOSIS — R059 Cough, unspecified: Secondary | ICD-10-CM

## 2014-06-26 DIAGNOSIS — B9689 Other specified bacterial agents as the cause of diseases classified elsewhere: Secondary | ICD-10-CM

## 2014-06-26 DIAGNOSIS — A499 Bacterial infection, unspecified: Secondary | ICD-10-CM

## 2014-06-26 MED ORDER — AMOXICILLIN-POT CLAVULANATE 875-125 MG PO TABS: 1.0000 | ORAL_TABLET | Freq: Two times a day (BID) | ORAL | Status: DC

## 2014-06-26 MED ORDER — HYDROCODONE-HOMATROPINE 5-1.5 MG/5ML PO SYRP: 5.0000 mL | ORAL_SOLUTION | Freq: Every evening | ORAL | Status: DC | PRN

## 2014-06-26 NOTE — Progress Notes (Signed)
   Subjective:    Patient ID: Joy Perez, female    DOB: 11/07/1960, 53 y.o.   MRN: [id number]  HPI  Pt presents to the clinic with 2 weeks of sinus pressure, cough, ST, ear pressure, cough-productive. She has tried claritin, mucinex, vicks vapor rub with little relief. No fever. Flew 1 week ago and seemed to make symptoms worse. She has a really bad frontal headache with pressure. No SOB or wheezing.     Review of Systems  All other systems reviewed and are negative.      Objective:   Physical Exam  Constitutional: She is oriented to person, place, and time. She appears well-developed and well-nourished.  HENT:  Head: Normocephalic and atraumatic.  Right Ear: External ear normal.  Left Ear: External ear normal.  Flushed cheeks.  Oropharynx erythematous without exudate.  Nasal turbinates red and swollen.   Eyes: Conjunctivae are normal. Right eye exhibits no discharge. Left eye exhibits no discharge.  Neck: Normal range of motion. Neck supple.  Cardiovascular: Normal rate, regular rhythm and normal heart sounds.   Pulmonary/Chest: Effort normal and breath sounds normal. She has no wheezes.  Lymphadenopathy:    She has no cervical adenopathy.  Neurological: She is alert and oriented to person, place, and time.  Skin: Skin is dry.  Psychiatric: She has a normal mood and affect. Her behavior is normal.          Assessment & Plan:  Bacterial sinusitis- hycodan given for cough. augmentin for 10 days. Continue mucinex. Consider flonase and nasal washes. folllow up as needed.

## 2014-06-26 NOTE — Patient Instructions (Signed)

## 2014-06-27 ENCOUNTER — Telehealth: Payer: Self-pay | Admitting: Family Medicine

## 2014-06-27 NOTE — Telephone Encounter (Signed)
Please call patient. We will stop the amlodipine and keep the diltiazem. There is potential interaction between the 2 medication. Please tell he I am sorry about this.  Since BP was good we will just use the dilt for now and see how her BP does.  We can always add Toprol for BP control if needed.

## 2014-06-27 NOTE — Telephone Encounter (Signed)
Left detailed vm with recommendations.Audelia Hives [place]

## 2014-12-05 ENCOUNTER — Other Ambulatory Visit: Payer: Self-pay | Admitting: Family Medicine

## 2014-12-05 ENCOUNTER — Encounter: Payer: Self-pay | Admitting: Family Medicine

## 2014-12-08 ENCOUNTER — Encounter: Payer: Self-pay | Admitting: Family Medicine

## 2014-12-08 ENCOUNTER — Ambulatory Visit (INDEPENDENT_AMBULATORY_CARE_PROVIDER_SITE_OTHER): Payer: 59 | Admitting: Family Medicine

## 2014-12-08 VITALS — BP 132/85 | HR 86 | Wt 175.0 lb

## 2014-12-08 DIAGNOSIS — Z1159 Encounter for screening for other viral diseases: Secondary | ICD-10-CM

## 2014-12-08 DIAGNOSIS — I10 Essential (primary) hypertension: Secondary | ICD-10-CM | POA: Diagnosis not present

## 2014-12-08 DIAGNOSIS — Z114 Encounter for screening for human immunodeficiency virus [HIV]: Secondary | ICD-10-CM

## 2014-12-08 DIAGNOSIS — N3281 Overactive bladder: Secondary | ICD-10-CM

## 2014-12-08 MED ORDER — SOLIFENACIN SUCCINATE 5 MG PO TABS: 5.0000 mg | ORAL_TABLET | Freq: Every day | ORAL | Status: DC

## 2014-12-08 MED ORDER — LOSARTAN POTASSIUM 100 MG PO TABS: ORAL_TABLET | ORAL | Status: DC

## 2014-12-08 NOTE — Progress Notes (Signed)
   Subjective:    Patient ID: Joy Perez, female    DOB: 02-12-1961, 54 y.o.   MRN: [id number]  HPI Hypertension- Pt denies chest pain, SOB, dizziness, or heart palpitations.  Taking meds as directed w/o problems.  Denies medication side effects.  She has been doing Veterinary surgeon and exercising.  She has lost 18 lbs in 6 monhts.   OAB - Says tried stopping the Vesicare but couldn't do without it. She would like up so refill it.  Was prescribed by another provider.   Review of Systems     Objective:   Physical Exam  Constitutional: She is oriented to person, place, and time. She appears well-developed and well-nourished.  HENT:  Head: Normocephalic and atraumatic.  Cardiovascular: Normal rate, regular rhythm and normal heart sounds.   Pulmonary/Chest: Effort normal and breath sounds normal.  Neurological: She is alert and oriented to person, place, and time.  Skin: Skin is warm and dry.  Psychiatric: She has a normal mood and affect. Her behavior is normal.          Assessment & Plan:  HTN - well controlled.  Contniue current regimen.  F/u in 45montns.  Due for CMP. Lipids are up-to-date.  OAB - will refill the vesicare. There is a mild interaction between the diltiazem and the Vesicare. A can raise the blood levels of Vesicare. She is Re: On the lower dose strength so I think this is perfectly fine.

## 2015-02-23 ENCOUNTER — Emergency Department (INDEPENDENT_AMBULATORY_CARE_PROVIDER_SITE_OTHER)
Admission: EM | Admit: 2015-02-23 | Discharge: 2015-02-23 | Disposition: A | Discharge status: Home / Self Care | Payer: 59 | Source: Home / Self Care | Attending: Family Medicine | Admitting: Family Medicine

## 2015-02-23 ENCOUNTER — Encounter: Payer: Self-pay | Admitting: *Deleted

## 2015-02-23 DIAGNOSIS — S0501XA Injury of conjunctiva and corneal abrasion without foreign body, right eye, initial encounter: Secondary | ICD-10-CM | POA: Diagnosis not present

## 2015-02-23 HISTORY — DX: Malignant (primary) neoplasm, unspecified: C80.1

## 2015-02-23 MED ORDER — POLYMYXIN B-TRIMETHOPRIM 10000-0.1 UNIT/ML-% OP SOLN: 1.0000 [drp] | OPHTHALMIC | Status: DC

## 2015-02-23 NOTE — ED Notes (Signed)
Pt c/o right eye redness and drainage x 2 days. Wearing contacts.

## 2015-02-23 NOTE — Discharge Instructions (Signed)
Avoid using right contact lens until all eye irritation resolved.   Corneal Abrasion The cornea is the clear covering at the front and center of the eye. When looking at the colored portion of the eye (iris), you are looking through the cornea. This very thin tissue is made up of many layers. The surface layer is a single layer of cells (corneal epithelium) and is one of the most sensitive tissues in the body. If a scratch or injury causes the corneal epithelium to come off, it is called a corneal abrasion. If the injury extends to the tissues below the epithelium, the condition is called a corneal ulcer. CAUSES   Scratches.  Trauma.  Foreign body in the eye. Some people have recurrences of abrasions in the area of the original injury even after it has healed (recurrent erosion syndrome). Recurrent erosion syndrome generally improves and goes away with time. SYMPTOMS   Eye pain.  Difficulty or inability to keep the injured eye open.  The eye becomes very sensitive to light.  Recurrent erosions tend to happen suddenly, first thing in the morning, usually after waking up and opening the eye. DIAGNOSIS  Your health care provider can diagnose a corneal abrasion during an eye exam. Dye is usually placed in the eye using a drop or a small paper strip moistened by your tears. When the eye is examined with a special light, the abrasion shows up clearly because of the dye. TREATMENT   Small abrasions may be treated with antibiotic drops or ointment alone.  HOME CARE INSTRUCTIONS  Use medicine or ointment as directed. Only take over-the-counter or prescription medicines for pain, discomfort, or fever as directed by your health care provider.  If your health care provider has given you a follow-up appointment, it is very important to keep that appointment. Not keeping the appointment could result in a severe eye infection or permanent loss of vision. If there is any problem keeping the  appointment, let your health care provider know. SEEK MEDICAL CARE IF:   You have pain, light sensitivity, and a scratchy feeling in one eye or both eyes.  Any kind of discharge develops from the eye after treatment or if the lids stick together in the morning.  You have the same symptoms in the morning as you did with the original abrasion days, weeks, or months after the abrasion healed. MAKE SURE YOU:   Understand these instructions.  Will watch your condition.  Will get help right away if you are not doing well or get worse. Document Released: 06/20/2000 Document Revised: 06/28/2013 Document Reviewed: 02/28/2013 [facility] Patient Information 2015 [place], [place]. This information is not intended to replace advice given to you by your health care provider. Make sure you discuss any questions you have with your health care provider.

## 2015-02-23 NOTE — ED Provider Notes (Signed)
CSN: [id number]     Arrival date & time 02/23/15  1634 History   First MD Initiated Contact with Patient 02/23/15 1648     Chief Complaint  Patient presents with  . Eye Problem      HPI Comments: Patient awoke yesterday morning with redness and irritation in her right eye.  She denies changes in vision.  She wears contact lens constantly.  No nasal congestion or URI symptoms.  Patient is a 54 y.o. female presenting with eye problem. The history is provided by the patient.  Eye Problem Location:  R eye Quality:  Aching Severity:  Mild Onset quality:  Sudden Duration:  1 day Timing:  Constant Progression:  Unchanged Chronicity:  New Context: contact lenses   Context: not direct trauma, not foreign body and not scratch   Relieved by:  None tried Worsened by:  Bright light and contact lenses Ineffective treatments:  None tried Associated symptoms: crusting, discharge and foreign body sensation   Associated symptoms: no blurred vision, no decreased vision, no double vision, no headaches and no itching   Risk factors: not exposed to pinkeye and no recent URI     Past Medical History  Diagnosis Date  . Hypertension     hctz- dizziness, toprol- gi upset  . Multinodular goiter 2009    thyroid bx consistent w/ hyperplastic nodules  . Cervical disc disease   . Enlarged thyroid   . Cancer     H/o breast CA   Past Surgical History  Procedure Laterality Date  . Cholecystectomy     Family History  Problem Relation Age of Onset  . Leukemia Mother   . Hypertension Father   . Hypothyroidism Father   . Other Father 60    AMI  . Hypertension Sister   . Hypothyroidism Daughter    Social History  Substance Use Topics  . Smoking status: Never Smoker   . Smokeless tobacco: Never Used  . Alcohol Use: No   OB History    No data available     Review of Systems  Eyes: Positive for discharge. Negative for blurred vision, double vision and itching.  Neurological: Negative for  headaches.  All other systems reviewed and are negative.   Allergies  Hydrochlorothiazide; Sulfonamide derivatives; and Sulfa antibiotics  Home Medications   Prior to Admission medications   Medication Sig Start Date End Date Taking? Authorizing Provider  CARTIA XT 300 MG 24 hr capsule TAKE 1 CAPSULE DAILY 12/05/14   Hali Marry, MD  letrozole [facility]) 2.5 MG tablet Take 2.5 mg by mouth daily.    Historical Provider, MD  losartan (COZAAR) 100 MG tablet TAKE 1 TABLET (100 MG TOTAL) BY MOUTH DAILY. 12/08/14   Hali Marry, MD  solifenacin (VESICARE) 5 MG tablet Take 1 tablet (5 mg total) by mouth daily. 12/08/14   Hali Marry, MD  trimethoprim-polymyxin b (POLYTRIM) ophthalmic solution Place 1 drop into the right eye every 4 (four) hours. 02/23/15   Kandra Nicolas, MD   BP 156/88 mmHg  Pulse 81  Temp(Src) 98.4 F (36.9 C) (Oral)  Resp 16  Wt 181 lb (82.101 kg)  SpO2 97% Physical Exam  Constitutional: She appears well-developed and well-nourished. No distress.  HENT:  Head: Atraumatic.  Nose: Nose normal.  Mouth/Throat: Oropharynx is clear and moist.  Eyes: EOM and lids are normal. Pupils are equal, round, and reactive to light. Lids are everted and swept, no foreign bodies found. Right eye exhibits no chemosis,  no discharge, no exudate and no hordeolum. No foreign body present in the right eye. Left eye exhibits no chemosis, no discharge, no exudate and no hordeolum. No foreign body present in the left eye. Right conjunctiva is injected. Right conjunctiva has no hemorrhage. Left conjunctiva is not injected. Left conjunctiva has no hemorrhage.    Fluorescein to right eye reveals small area of uptake centrally with a "ground glass" appearance.  Neck: Neck supple.  Lymphadenopathy:    She has no cervical adenopathy.  Nursing note and vitals reviewed.   ED Course  Procedures     MDM   1. Right corneal abrasion, initial encounter     Avoid using right  contact lens until all eye irritation resolved. Begin Polytrim ophth suspension Followup with ophthalmologist if not resolved 3 to 4 days.    Kandra Nicolas, MD 02/23/15 [phone]

## 2015-03-04 ENCOUNTER — Other Ambulatory Visit: Payer: Self-pay | Admitting: Family Medicine

## 2015-04-06 ENCOUNTER — Encounter: Payer: Self-pay | Admitting: Osteopathic Medicine

## 2015-04-06 ENCOUNTER — Ambulatory Visit (INDEPENDENT_AMBULATORY_CARE_PROVIDER_SITE_OTHER): Payer: 59 | Admitting: Osteopathic Medicine

## 2015-04-06 VITALS — BP 158/94 | HR 97 | Temp 98.5°F | Ht 64.0 in | Wt 184.0 lb

## 2015-04-06 DIAGNOSIS — J302 Other seasonal allergic rhinitis: Secondary | ICD-10-CM | POA: Diagnosis not present

## 2015-04-06 DIAGNOSIS — J321 Chronic frontal sinusitis: Secondary | ICD-10-CM | POA: Diagnosis not present

## 2015-04-06 MED ORDER — FLUTICASONE PROPIONATE 50 MCG/ACT NA SUSP: 2.0000 | Freq: Every day | NASAL | Status: DC

## 2015-04-06 MED ORDER — AMOXICILLIN-POT CLAVULANATE 875-125 MG PO TABS: 1.0000 | ORAL_TABLET | Freq: Two times a day (BID) | ORAL | Status: DC

## 2015-04-06 NOTE — Progress Notes (Signed)
HPI: Joy Perez is a 54 y.o. female who presents to [place]  today for chief complaint of: No chief complaint on file.   . Location: sinuses . Quality: pressure . Severity: severe . Duration: 2 months . Modifying factors: has tried the following OTC medications: mucinex  without relief . Assoc signs/symptoms: no fever/chills, no productive cough   Past medical, social and family history reviewed: Past Medical History  Diagnosis Date  . Hypertension     hctz- dizziness, toprol- gi upset  . Multinodular goiter 2009    thyroid bx consistent w/ hyperplastic nodules  . Cervical disc disease   . Enlarged thyroid   . Cancer     H/o breast CA   Past Surgical History  Procedure Laterality Date  . Cholecystectomy     Social History  Substance Use Topics  . Smoking status: Never Smoker   . Smokeless tobacco: Never Used  . Alcohol Use: No   Family History  Problem Relation Age of Onset  . Leukemia Mother   . Hypertension Father   . Hypothyroidism Father   . Other Father 55    AMI  . Hypertension Sister   . Hypothyroidism Daughter     Current Outpatient Prescriptions  Medication Sig Dispense Refill  . CARTIA XT 300 MG 24 hr capsule TAKE 1 CAPSULE DAILY 90 capsule 0  . letrozole (FEMARA) 2.5 MG tablet Take 2.5 mg by mouth daily.    Marland Kitchen losartan (COZAAR) 100 MG tablet TAKE 1 TABLET (100 MG TOTAL) BY MOUTH DAILY. 90 tablet 1  . solifenacin (VESICARE) 5 MG tablet Take 1 tablet (5 mg total) by mouth daily. 90 tablet 1  . trimethoprim-polymyxin b (POLYTRIM) ophthalmic solution Place 1 drop into the right eye every 4 (four) hours. 10 mL 0   No current facility-administered medications for this visit.   Allergies  Allergen Reactions  . Hydrochlorothiazide Other (See Comments)    Dizziness and hypokalemia  . Sulfonamide Derivatives   . Sulfa Antibiotics Rash      Review of Systems: CONSTITUTIONAL: no  fever/chills HEAD/EYES/EARS/NOSE/THROAT: no headache, no vision change or hearing change, no sore throat CARDIAC: No chest pain/pressure/palpitations, no orthopnea RESPIRATORY: yes cough, no shortness of breath  Exam:  There were no vitals taken for this visit. Constitutional: VSS, see above. General Appearance: alert, well-developed, well-nourished, NAD Eyes: Normal lids and conjunctive, non-icteric sclera, PERRLA Ears, Nose, Mouth, Throat: Normal external inspection ears/nares/mouth/lips/gums, normal TM bilaterally, MMM; posterior pharynx without erythema, without exudate Neck: No masses, trachea midline. No thyroid enlargement/tenderness/mass appreciated, normal lymph nodes Respiratory: Normal respiratory effort. no wheeze/rhonchi/rales Cardiovascular: S1/S2 normal, no murmur/rub/gallop auscultated. RRR. No carotid bruit or JVD. No lower extremity edema.   No results found for this or any previous visit (from the past 72 hour(s)).    ASSESSMENT/PLAN:  Seasonal allergies - Plan: fluticasone (FLONASE) 50 MCG/ACT nasal spray  Chronic frontal sinusitis - Plan: amoxicillin-clavulanate (AUGMENTIN) 875-125 MG tablet   Patient has been educated on significant possible side effects of medication and is instructed to contact me or other medical professional with any concerns about side effects.   Pt instructed on proper instillation for nasal spray.

## 2015-04-06 NOTE — Patient Instructions (Signed)
Take all antibiotics even if you are feeling better.  Can conitnue the nasal spray indefinitely if this is helping with your allergies, 1 - 2 sprays in each nostril twice per day or daily, whichever controls your symptoms. .   Sinusitis Sinusitis is redness, soreness, and inflammation of the paranasal sinuses. Paranasal sinuses are air pockets within the bones of your face (beneath the eyes, the middle of the forehead, or above the eyes). In healthy paranasal sinuses, mucus is able to drain out, and air is able to circulate through them by way of your nose. However, when your paranasal sinuses are inflamed, mucus and air can become trapped. This can allow bacteria and other germs to grow and cause infection. Sinusitis can develop quickly and last only a short time (acute) or continue over a long period (chronic). Sinusitis that lasts for more than 12 weeks is considered chronic.  CAUSES  Causes of sinusitis include:  Allergies.  Structural abnormalities, such as displacement of the cartilage that separates your nostrils (deviated septum), which can decrease the air flow through your nose and sinuses and affect sinus drainage.  Functional abnormalities, such as when the small hairs (cilia) that line your sinuses and help remove mucus do not work properly or are not present. SIGNS AND SYMPTOMS  Symptoms of acute and chronic sinusitis are the same. The primary symptoms are pain and pressure around the affected sinuses. Other symptoms include:  Upper toothache.  Earache.  Headache.  Bad breath.  Decreased sense of smell and taste.  A cough, which worsens when you are lying flat.  Fatigue.  Fever.  Thick drainage from your nose, which often is green and may contain pus (purulent).  Swelling and warmth over the affected sinuses. DIAGNOSIS  Your health care Royce Sciara will perform a physical exam. During the exam, your health care Alexcis Bicking may:  Look in your nose for signs of abnormal  growths in your nostrils (nasal polyps).  Tap over the affected sinus to check for signs of infection.  View the inside of your sinuses (endoscopy) using an imaging device that has a light attached (endoscope). If your health care Falecia Vannatter suspects that you have chronic sinusitis, one or more of the following tests may be recommended:  Allergy tests.  Nasal culture. A sample of mucus is taken from your nose, sent to a lab, and screened for bacteria.  Nasal cytology. A sample of mucus is taken from your nose and examined by your health care Ayeden Gladman to determine if your sinusitis is related to an allergy. TREATMENT  Most cases of acute sinusitis are related to a viral infection and will resolve on their own within 10 days. Sometimes medicines are prescribed to help relieve symptoms (pain medicine, decongestants, nasal steroid sprays, or saline sprays).  However, for sinusitis related to a bacterial infection, your health care Rilynne Lonsway will prescribe antibiotic medicines. These are medicines that will help kill the bacteria causing the infection.  Rarely, sinusitis is caused by a fungal infection. In theses cases, your health care Donato Studley will prescribe antifungal medicine. For some cases of chronic sinusitis, surgery is needed. Generally, these are cases in which sinusitis recurs more than 3 times per year, despite other treatments. HOME CARE INSTRUCTIONS   Drink plenty of water. Water helps thin the mucus so your sinuses can drain more easily.  Use a humidifier.  Inhale steam 3 to 4 times a day (for example, sit in the bathroom with the shower running).  Apply a warm, moist  washcloth to your face 3 to 4 times a day, or as directed by your health care Makani Seckman.  Use saline nasal sprays to help moisten and clean your sinuses.  Take medicines only as directed by your health care Cade Dashner.  If you were prescribed either an antibiotic or antifungal medicine, finish it all even if you start  to feel better. SEEK IMMEDIATE MEDICAL CARE IF:  You have increasing pain or severe headaches.  You have nausea, vomiting, or drowsiness.  You have swelling around your face.  You have vision problems.  You have a stiff neck.  You have difficulty breathing. MAKE SURE YOU:   Understand these instructions.  Will watch your condition.  Will get help right away if you are not doing well or get worse. Document Released: 06/23/2005 Document Revised: 11/07/2013 Document Reviewed: 07/08/2011 [facility] Patient Information 2015 [place], [place]. This information is not intended to replace advice given to you by your health care Stephen Turnbaugh. Make sure you discuss any questions you have with your health care Vicy Medico.

## 2015-04-27 ENCOUNTER — Ambulatory Visit (INDEPENDENT_AMBULATORY_CARE_PROVIDER_SITE_OTHER): Payer: 59 | Admitting: Family Medicine

## 2015-04-27 ENCOUNTER — Encounter: Payer: Self-pay | Admitting: Family Medicine

## 2015-04-27 VITALS — BP 138/74 | HR 88 | Temp 97.9°F | Resp 18

## 2015-04-27 DIAGNOSIS — R5383 Other fatigue: Secondary | ICD-10-CM | POA: Diagnosis not present

## 2015-04-27 DIAGNOSIS — D509 Iron deficiency anemia, unspecified: Secondary | ICD-10-CM

## 2015-04-27 DIAGNOSIS — J0191 Acute recurrent sinusitis, unspecified: Secondary | ICD-10-CM | POA: Diagnosis not present

## 2015-04-27 DIAGNOSIS — H9201 Otalgia, right ear: Secondary | ICD-10-CM

## 2015-04-27 DIAGNOSIS — E611 Iron deficiency: Secondary | ICD-10-CM

## 2015-04-27 LAB — CBC WITH DIFFERENTIAL/PLATELET
Basophils Absolute: 0 10*3/uL (ref 0.0–0.1)
Basophils Relative: 0 % (ref 0–1)
EOS ABS: 0.1 10*3/uL (ref 0.0–0.7)
EOS PCT: 1 % (ref 0–5)
HCT: 37.7 % (ref 36.0–46.0)
Hemoglobin: 12.8 g/dL (ref 12.0–15.0)
LYMPHS ABS: 1.3 10*3/uL (ref 0.7–4.0)
Lymphocytes Relative: 19 % (ref 12–46)
MCH: 28.4 pg (ref 26.0–34.0)
MCHC: 34 g/dL (ref 30.0–36.0)
MCV: 83.8 fL (ref 78.0–100.0)
MONO ABS: 0.4 10*3/uL (ref 0.1–1.0)
MONOS PCT: 6 % (ref 3–12)
MPV: 10.7 fL (ref 8.6–12.4)
Neutro Abs: 5 10*3/uL (ref 1.7–7.7)
Neutrophils Relative %: 74 % (ref 43–77)
PLATELETS: 236 10*3/uL (ref 150–400)
RBC: 4.5 MIL/uL (ref 3.87–5.11)
RDW: 14.3 % (ref 11.5–15.5)
WBC: 6.7 10*3/uL (ref 4.0–10.5)

## 2015-04-27 MED ORDER — AZITHROMYCIN 250 MG PO TABS: ORAL_TABLET | ORAL | Status: AC

## 2015-04-27 NOTE — Progress Notes (Signed)
   Subjective:    Patient ID: Joy Perez, female    DOB: 1961-06-10, 54 y.o.   MRN: [id number]  HPI Ear Pain Right ear pain, fatigue and sore throat on going approx 2 mos. She was treated in augmentin in September and says the sinus headaches were better. Started her daughter Ciprodex x 5 days, for her ear and the ear is better but still having pain in her neck and lots of post nasal drip. Using Tylenol PM. Still has sinus congestion.    Low iron - she has been taking iron supplement. She would like to go ahead and have her levels rechecked. Next  She's also been taking potassium. She says on her levels get low she feels weak and tired and so she started taking it again but wants to make sure that it safe. She does feel like she is having fatigue.  Review of Systems     Objective:   Physical Exam  Constitutional: She is oriented to person, place, and time. She appears well-developed and well-nourished.  HENT:  Head: Normocephalic and atraumatic.  Right Ear: External ear normal.  Left Ear: External ear normal.  Nose: Nose normal.  Mouth/Throat: Oropharynx is clear and moist.  TMs and canals are clear.   Eyes: Conjunctivae and EOM are normal. Pupils are equal, round, and reactive to light.  Neck: Neck supple. No thyromegaly present.  Cardiovascular: Normal rate, regular rhythm and normal heart sounds.   Pulmonary/Chest: Effort normal and breath sounds normal. She has no wheezes.  Lymphadenopathy:    She has no cervical adenopathy.  Neurological: She is alert and oriented to person, place, and time.  Skin: Skin is warm and dry.  Psychiatric: She has a normal mood and affect.          Assessment & Plan:  Acute sinusiits with right ear pain.  Will tx with augmentin. Call if not better in one week.    Low iron-recheck ferritin and CBC.  Since she's taking potassium supplement recommend we recheck her levels just to make sure that she's not taking too much.

## 2015-04-28 LAB — COMPLETE METABOLIC PANEL WITHOUT GFR
ALT: 16 U/L (ref 6–29)
AST: 13 U/L (ref 10–35)
Albumin: 4.1 g/dL (ref 3.6–5.1)
Alkaline Phosphatase: 80 U/L (ref 33–130)
BUN: 15 mg/dL (ref 7–25)
CO2: 29 mmol/L (ref 20–31)
Calcium: 9.5 mg/dL (ref 8.6–10.4)
Chloride: 102 mmol/L (ref 98–110)
Creat: 0.64 mg/dL (ref 0.50–1.05)
GFR, Est African American: >89 mL/min
GFR, Est Non African American: >89 mL/min
Glucose, Bld: 156 mg/dL — ABNORMAL HIGH (ref 65–99)
Potassium: 4.1 mmol/L (ref 3.5–5.3)
Sodium: 141 mmol/L (ref 135–146)
Total Bilirubin: 0.4 mg/dL (ref 0.2–1.2)
Total Protein: 6.6 g/dL (ref 6.1–8.1)

## 2015-04-28 LAB — HIV ANTIBODY (ROUTINE TESTING W REFLEX): HIV 1&2 Ab, 4th Generation: NONREACTIVE

## 2015-04-28 LAB — FERRITIN: Ferritin: 58 ng/mL (ref 10–291)

## 2015-04-28 LAB — HEPATITIS C ANTIBODY: HCV Ab: NEGATIVE

## 2015-06-01 ENCOUNTER — Other Ambulatory Visit: Payer: Self-pay | Admitting: Family Medicine

## 2015-06-06 LAB — BASIC METABOLIC PANEL
BUN: 19 mg/dL (ref 4–21)
Creatinine: 0.7 mg/dL (ref 0.5–1.1)
GLUCOSE: 110 mg/dL
POTASSIUM: 4.2 mmol/L (ref 3.4–5.3)
Sodium: 142 mmol/L (ref 137–147)

## 2015-06-06 LAB — COMPREHENSIVE METABOLIC PANEL
Albumin Serum: 4.5
CALCIUM: 9.6 mg/dL
CHLORIDE: 98 mmol/L
CO2: 27 mmol/L
EGFR (African American): 114
EGFR (Non-African Amer.): 99
Globulin, Total: 2.8
PROTEIN: 7.3

## 2015-06-06 LAB — HEPATIC FUNCTION PANEL
ALT: 20 U/L (ref 7–35)
AST: 16 U/L (ref 13–35)
Alkaline Phosphatase: 96 U/L (ref 25–125)
BILIRUBIN, TOTAL: 0.3 mg/dL

## 2015-06-07 ENCOUNTER — Other Ambulatory Visit: Payer: Self-pay | Admitting: Family Medicine

## 2015-06-08 ENCOUNTER — Ambulatory Visit (INDEPENDENT_AMBULATORY_CARE_PROVIDER_SITE_OTHER): Payer: 59 | Admitting: Family Medicine

## 2015-06-08 ENCOUNTER — Encounter: Payer: Self-pay | Admitting: Family Medicine

## 2015-06-08 VITALS — BP 134/77 | HR 81 | Temp 98.4°F | Resp 18 | Wt 185.6 lb

## 2015-06-08 DIAGNOSIS — I10 Essential (primary) hypertension: Secondary | ICD-10-CM | POA: Diagnosis not present

## 2015-06-08 DIAGNOSIS — H6980 Other specified disorders of Eustachian tube, unspecified ear: Secondary | ICD-10-CM

## 2015-06-08 DIAGNOSIS — J069 Acute upper respiratory infection, unspecified: Secondary | ICD-10-CM | POA: Diagnosis not present

## 2015-06-08 DIAGNOSIS — H6122 Impacted cerumen, left ear: Secondary | ICD-10-CM | POA: Diagnosis not present

## 2015-06-08 LAB — LIPID PANEL
CHOLESTEROL: 163 mg/dL (ref 125–200)
HDL: 45 mg/dL — ABNORMAL LOW (ref >=46)
LDL Cholesterol: 101 mg/dL (ref <130)
Total CHOL/HDL Ratio: 3.6 Ratio (ref <=5.0)
Triglycerides: 83 mg/dL (ref <150)
VLDL: 17 mg/dL (ref <30)

## 2015-06-08 MED ORDER — PREDNISONE 20 MG PO TABS: 40.0000 mg | ORAL_TABLET | Freq: Every day | ORAL | Status: DC

## 2015-06-08 MED ORDER — LOSARTAN POTASSIUM 100 MG PO TABS: 100.0000 mg | ORAL_TABLET | Freq: Every day | ORAL | Status: AC

## 2015-06-08 NOTE — Progress Notes (Signed)
   Subjective:    Patient ID: Joy Perez, female    DOB: May 27, 1961, 54 y.o.   MRN: [id number]  HPI Hypertension- Pt denies chest pain, SOB, dizziness, or heart palpitations.  Taking meds as directed w/o problems.  Denies medication side effects.    Says about 6 months ago and she had fullness and problems with her ear and I gave her zpack and it cleared up. Now she has a new URI x 10 days and says has notices some pain on the right side of her neck. Taking mucinex. She is starting to get some better.  No fever, chills.     Mole on right inner thigh - has been there efor > 1 year. No changes.  No pain or irritation.  Left ear is blocked is cerumen. Her oncologist had noted this and had asked her to have [place] remove the cerumen when she came in for her next visit. She hasn't been hearing as well out of this.    Review of Systems     Objective:   Physical Exam  Constitutional: She is oriented to person, place, and time. She appears well-developed and well-nourished.  HENT:  Head: Normocephalic and atraumatic.  Cardiovascular: Normal rate, regular rhythm and normal heart sounds.   Pulmonary/Chest: Effort normal and breath sounds normal.  Neurological: She is alert and oriented to person, place, and time.  Skin: Skin is warm and dry.  Psychiatric: She has a normal mood and affect. Her behavior is normal.     She has a small approximately 3 mm dark red/purple pigmented macular brown lesion on the right upper inner thigh.     Assessment & Plan:  HTN - uncontrolled. Continue current regimen. Follow up in 6 months. Due for fasting lipid panel. She had an up-to-date CMP done with her oncologist.    Eustachian tube dyfunction with URI - recommend a trial of 5 days of prednisone. Warned about potential side effects. Diastolic she also has a concomitant upper respiratory infection which might be increasing her symptoms though it seems like that is actually resolving on its own. Also  recommend she go ahead and restart her Flonase which she has at home.  Nevus - likley benign based on exam today. Give her reassurance. Certainly fits changing becomes itchy or irritated or is getting larger then please let us know and we can always do a shave biopsy.  Cerumen impaction, left ear-ear curetted today in the office. Patient tolerated well.  Cerumen was removed using  soft plastic curettes.she tolerated this well.  Tympanic membranes are intact following the procedure.  Auditory canals are normal.

## 2015-06-10 NOTE — Progress Notes (Signed)
Quick Note:  All labs are normal. ______ 

## 2015-06-13 ENCOUNTER — Encounter: Payer: Self-pay | Admitting: Family Medicine

## 2015-06-25 ENCOUNTER — Other Ambulatory Visit: Payer: Self-pay | Admitting: Family Medicine

## 2015-07-03 ENCOUNTER — Other Ambulatory Visit: Payer: Self-pay

## 2015-07-03 MED ORDER — SOLIFENACIN SUCCINATE 5 MG PO TABS: 5.0000 mg | ORAL_TABLET | Freq: Every day | ORAL | Status: DC

## 2015-07-03 NOTE — Telephone Encounter (Signed)
Patient called requesting 90 supply of vesicare.  [facility]  Patient aware.

## 2015-08-02 NOTE — Telephone Encounter (Deleted)
Patient states that she feels like her methocarbamol is ineffective she would like to know if she could be started on something else. She states that she would like a medication for migraines as well as a refill on her xanax. I advised that she will most likely need to come in for a visit but, she suggested that I ask you first.

## 2015-08-02 NOTE — Telephone Encounter (Signed)
Erroneous encounter, opened on wrong patient.

## 2015-08-27 ENCOUNTER — Ambulatory Visit (INDEPENDENT_AMBULATORY_CARE_PROVIDER_SITE_OTHER): Payer: 59 | Admitting: Osteopathic Medicine

## 2015-08-27 ENCOUNTER — Encounter: Payer: Self-pay | Admitting: Osteopathic Medicine

## 2015-08-27 ENCOUNTER — Telehealth: Payer: Self-pay

## 2015-08-27 VITALS — BP 136/75 | HR 87 | Temp 98.1°F | Wt 190.0 lb

## 2015-08-27 DIAGNOSIS — R109 Unspecified abdominal pain: Secondary | ICD-10-CM

## 2015-08-27 DIAGNOSIS — N3001 Acute cystitis with hematuria: Secondary | ICD-10-CM

## 2015-08-27 DIAGNOSIS — N10 Acute tubulo-interstitial nephritis: Secondary | ICD-10-CM | POA: Diagnosis not present

## 2015-08-27 LAB — POCT URINALYSIS DIPSTICK
BILIRUBIN UA: NEGATIVE
Glucose, UA: NEGATIVE
KETONES UA: NEGATIVE
Nitrite, UA: POSITIVE
PH UA: 8.5
SPEC GRAV UA: 1.02
Urobilinogen, UA: 0.2

## 2015-08-27 MED ORDER — CIPROFLOXACIN HCL 500 MG PO TABS
500.0000 mg | ORAL_TABLET | Freq: Two times a day (BID) | ORAL | Status: DC
Start: 2015-08-27 — End: 2015-08-27

## 2015-08-27 MED ORDER — CIPROFLOXACIN HCL 500 MG PO TABS
500.0000 mg | ORAL_TABLET | Freq: Two times a day (BID) | ORAL | Status: DC
Start: 2015-08-27 — End: 2015-12-07

## 2015-08-27 NOTE — Progress Notes (Signed)
NO SHOW

## 2015-08-27 NOTE — Telephone Encounter (Signed)
Patient was seen today for a visit and Antibiotic was sent to [facility] today and needs to be changed to [facility] on [address] in [place]. Thanks

## 2015-08-27 NOTE — Patient Instructions (Signed)
We are treating for UTI/possible early kidney infection with ciprofloxacin antibiotic, take 1 pill twice a day for 7 days, finish all antibiotics even if you are feeling better. If we need to switch these antibiotics based on your eventual culture results, we will let you know. Please return to the clinic or go to ER if pain becomes severe or if fever persists, if you develop serious abdominal pain especially if you have nausea and vomiting with it. Otherwise follow-up as needed, and follow-up with Dr. Madilyn Fireman for routine care.Marland Kitchen

## 2015-08-27 NOTE — Telephone Encounter (Signed)
Rx resent to right pharmacy. Rhonda Cunningham,CMA

## 2015-08-27 NOTE — Addendum Note (Signed)
Addended by: Doree Albee on: 08/27/2015 02:25 PM   Modules accepted: Orders

## 2015-08-27 NOTE — Addendum Note (Signed)
Addended by: Maryla Morrow on: 08/27/2015 01:56 PM   Modules accepted: Orders, Level of Service

## 2015-08-27 NOTE — Progress Notes (Signed)
Chief Complaint: Possible UTI  History of Present Illness: Joy Perez is a 55 y.o. female who presents to [facility]  today with concerns for acute urinary tract infection  Onset: 4 days ago  Quality: "twinge" and ubrning when pee, fever at night and side hurts Associated Symptoms:   Frequency: yes  Hematuria: no  Odor: no  Fever/chills: chills/fever at night  Flank Pain: Right  Vaginal bleeding/discharge: no Modifying Factors:  Previous UTI: > year ago  Recurrent UTI (3 times/more annually): no  Abx in past 3 months: no  Complicated (any of the following): no ?Diabetes ?Pregnancy ?Symptoms for seven or more days before seeking care ?Hospital acquired infection ?Renal failure ?Urinary tract obstruction ?Presence of an indwelling urethral catheter, stent, nephrostomy tube or urinary diversion ?Functional or anatomic abnormality of the urinary tract ?Renal transplantation ?Immunosuppression   Past medical, social and family history reviewed: Past Medical History  Diagnosis Date  . Hypertension     hctz- dizziness, toprol- gi upset  . Multinodular goiter 2009    thyroid bx consistent w/ hyperplastic nodules  . Cervical disc disease   . Enlarged thyroid   . Cancer [facility])     H/o breast CA   Past Surgical History  Procedure Laterality Date  . Cholecystectomy     Social History  Substance Use Topics  . Smoking status: Never Smoker   . Smokeless tobacco: Never Used  . Alcohol Use: No   The patient has a family history of  Current Outpatient Prescriptions  Medication Sig Dispense Refill  . Calcium Carbonate (CALCIUM 600 PO) Take by mouth.    . CARTIA XT 300 MG 24 hr capsule TAKE 1 CAPSULE DAILY 90 capsule 1  . fluticasone (FLONASE) 50 MCG/ACT nasal spray Place 2 sprays into both nostrils daily. (Patient taking differently: Place 2 sprays into both nostrils as needed. ) 16 g 3  . IRON PO Take by mouth.    . letrozole  (FEMARA) 2.5 MG tablet Take 2.5 mg by mouth daily.    Marland Kitchen losartan (COZAAR) 100 MG tablet Take 1 tablet (100 mg total) by mouth daily. 90 tablet 3  . solifenacin (VESICARE) 5 MG tablet Take 1 tablet (5 mg total) by mouth daily. 90 tablet 1   No current facility-administered medications for this visit.   Allergies  Allergen Reactions  . Hydrochlorothiazide Other (See Comments)    Dizziness and hypokalemia  . Sulfonamide Derivatives   . Sulfa Antibiotics Rash     Review of Systems: CONSTITUTIONAL: Positive nighttime fever/chills CARDIAC: No chest pain/pressure/palpitations, no orthopnea RESPIRATORY: No cough/shortness of breath/wheeze GASTROINTESTINAL: No nausea/vomiting/abdominal pain/blood in stool/diarrhea/constipation MUSCULOSKELETAL: No myalgia/arthralgia/back pain GENITOURINARY: Negative incontinence, Negative abnormal genital bleeding/discharge. (+)Dysuria/UTI symptoms as per HPI   Exam:  BP 136/75 mmHg  Pulse 87  Temp(Src) 98.1 F (36.7 C) (Oral)  Wt 190 lb (86.183 kg) Constitutional: VSS, see above. General Appearance: alert, well-developed, well-nourished, NAD Respiratory: Normal respiratory effort. Breath sounds normal, no wheeze/rhonchi/rales Cardiovascular: S1/S2 normal, no murmur/rub/gallop auscultated. RRR Gastrointestinal: Nontender, no masses. No hepatomegaly, no splenomegaly. No hernia appreciated. Rectal exam deferred.  Musculoskeletal: Gait normal. No clubbing/cyanosis of digits. Lloyd sign Negative bilateral  Results for orders placed or performed in visit on 08/27/15 (from the past 24 hour(s))  POCT Urinalysis Dipstick     Status: Abnormal   Collection Time: 08/27/15  1:43 PM  Result Value Ref Range   Color, UA YELLOW    Clarity, UA CLOUDY  Glucose, UA NEG    Bilirubin, UA NEG    Ketones, UA NEG    Spec Grav, UA 1.020    Blood, UA MODERATE    pH, UA 8.5    Protein, UA >300    Urobilinogen, UA 0.2    Nitrite, UA POSITIVE    Leukocytes, UA large  (3+) (A) Negative    Previous Culture Results:  No growth 10/02/10   ASSESSMENT/PLAN: Earnie Larsson sign negative, however given the fever and flank pain we'll treat as pyelonephritis, see patient instructions for full details which were reviewed with her regarding follow-up care and culture results as well as RTC/ER precautions.  Acute cystitis with hematuria - Plan: ciprofloxacin (CIPRO) 500 MG tablet  Acute pyelonephritis  Abdominal pain, unspecified abdominal location - Plan: POCT Urinalysis Dipstick  Return if symptoms worsen or fail to improve.

## 2015-08-27 NOTE — Addendum Note (Signed)
Addended by: Doree Albee on: 08/27/2015 01:44 PM   Modules accepted: Orders, Medications

## 2015-08-30 LAB — URINE CULTURE: Colony Count: >=100000

## 2015-11-30 ENCOUNTER — Other Ambulatory Visit: Payer: Self-pay | Admitting: Family Medicine

## 2015-12-07 ENCOUNTER — Ambulatory Visit (INDEPENDENT_AMBULATORY_CARE_PROVIDER_SITE_OTHER): Payer: 59 | Admitting: Family Medicine

## 2015-12-07 ENCOUNTER — Encounter: Payer: Self-pay | Admitting: Family Medicine

## 2015-12-07 VITALS — BP 136/80 | HR 72 | Wt 192.0 lb

## 2015-12-07 DIAGNOSIS — R109 Unspecified abdominal pain: Secondary | ICD-10-CM | POA: Diagnosis not present

## 2015-12-07 DIAGNOSIS — I10 Essential (primary) hypertension: Secondary | ICD-10-CM

## 2015-12-07 DIAGNOSIS — N3281 Overactive bladder: Secondary | ICD-10-CM | POA: Diagnosis not present

## 2015-12-07 LAB — URINALYSIS, ROUTINE W REFLEX MICROSCOPIC
BILIRUBIN URINE: NEGATIVE
GLUCOSE, UA: NEGATIVE
HGB URINE DIPSTICK: NEGATIVE
KETONES UR: NEGATIVE
Nitrite: NEGATIVE
PROTEIN: NEGATIVE
Specific Gravity, Urine: 1.011 (ref 1.001–1.035)
pH: 7 (ref 5.0–8.0)

## 2015-12-07 LAB — URINALYSIS, MICROSCOPIC ONLY
Bacteria, UA: NONE SEEN [HPF]
Casts: NONE SEEN [LPF]
Crystals: NONE SEEN [HPF]
RBC / HPF: NONE SEEN RBC/HPF (ref <=2)
Squamous Epithelial / LPF: NONE SEEN [HPF] (ref <=5)
YEAST: NONE SEEN [HPF]

## 2015-12-07 LAB — BASIC METABOLIC PANEL WITH GFR
BUN: 16 mg/dL (ref 7–25)
CALCIUM: 9.5 mg/dL (ref 8.6–10.4)
CO2: 28 mmol/L (ref 20–31)
Chloride: 100 mmol/L (ref 98–110)
Creat: 0.66 mg/dL (ref 0.50–1.05)
GLUCOSE: 106 mg/dL — AB (ref 65–99)
Potassium: 4.8 mmol/L (ref 3.5–5.3)
Sodium: 140 mmol/L (ref 135–146)

## 2015-12-07 MED ORDER — SOLIFENACIN SUCCINATE 10 MG PO TABS: 10.0000 mg | ORAL_TABLET | Freq: Every day | ORAL | Status: DC

## 2015-12-07 NOTE — Progress Notes (Signed)
Subjective:    CC: HTN  HPI:  Hypertension- Pt denies chest pain, SOB, dizziness, or heart palpitations.  Taking meds as directed w/o problems.  Denies medication side effects.    Right flank pain on and off for several months. Came in in Feb for similar sxs but also had a UTI at that time.  No urinary sxs this time. In February she did have a positive urine culture for Escherichia coli.  She said she's not sure if it's mostly skeletal not. She says it's not so much painful that keeps her from doing things as it just feels swollen.  Overactive bladder-she has noticed that her symptoms have gotten a little bit worse. Mostly when she gets really stand up from a sitting position she'll suddenly get the urge to go. She wants to know if we could try increasing her Vesicare to see if that helps. Area or dysuria.   Past medical history, Surgical history, Family history not pertinant except as noted below, Social history, Allergies, and medications have been entered into the medical record, reviewed, and corrections made.   Review of Systems: No fevers, chills, night sweats, weight loss, chest pain, or shortness of breath.   Objective:    General: Well Developed, well nourished, and in no acute distress.  Neuro: Alert and oriented x3, extra-ocular muscles intact, sensation grossly intact.  HEENT: Normocephalic, atraumatic  Skin: Warm and dry, no rashes. Cardiac: Regular rate and rhythm, no murmurs rubs or gallops, no lower extremity edema.  Respiratory: Clear to auscultation bilaterally. Not using accessory muscles, speaking in full sentences. Abdomen: Soft, nontender, normal bowel sounds. No organomegaly. MSK: Normal lumbar flexion extension rotation right and left side bending without discomfort or pain. Hip, knee, ankle strength is 5 out of 5 bilaterally. Patellar reflexes 1+ on the left 0 on the right. Upper, reflexes are 0+ bilaterally.   Impression and Recommendations:   HTN - Well  controlled. Continue current regimen. Due for BMP.  Right flank pain - discussed working this up further. Will repeat urinalysis as well as check serum kidney function. We'll schedule her for an abdominal ultrasound to take a look at the kidneys as well. If all normal then this certainly could be coming from her spine. She has a fairly normal muscular skeletal exam today.  Overactive bladder-we'll try increasing her Vesicare over the next month to see if this improves her symptoms. If not and please let us know. We'll do a repeat urinalysis today.

## 2015-12-12 ENCOUNTER — Ambulatory Visit (INDEPENDENT_AMBULATORY_CARE_PROVIDER_SITE_OTHER): Payer: 59

## 2015-12-12 DIAGNOSIS — N2889 Other specified disorders of kidney and ureter: Secondary | ICD-10-CM

## 2015-12-12 DIAGNOSIS — R160 Hepatomegaly, not elsewhere classified: Secondary | ICD-10-CM

## 2015-12-12 DIAGNOSIS — R109 Unspecified abdominal pain: Secondary | ICD-10-CM

## 2015-12-14 ENCOUNTER — Telehealth: Payer: Self-pay | Admitting: Family Medicine

## 2015-12-14 NOTE — Telephone Encounter (Signed)
Per PCP request, spoke with office of Dr. Georgiann Cocker (oncology). They have already scheduled a PET scan for Pt, she is aware. After results are complete they will bring Pt in for review and to create a plan of care. PCP advised of current status.

## 2016-02-28 ENCOUNTER — Other Ambulatory Visit: Payer: Self-pay | Admitting: Family Medicine

## 2016-04-11 ENCOUNTER — Encounter: Payer: Self-pay | Admitting: Family Medicine

## 2016-04-11 ENCOUNTER — Ambulatory Visit (INDEPENDENT_AMBULATORY_CARE_PROVIDER_SITE_OTHER): Payer: 59 | Admitting: Family Medicine

## 2016-04-11 VITALS — BP 143/65 | HR 81 | Wt 193.0 lb

## 2016-04-11 DIAGNOSIS — R29898 Other symptoms and signs involving the musculoskeletal system: Secondary | ICD-10-CM | POA: Diagnosis not present

## 2016-04-11 DIAGNOSIS — M62838 Other muscle spasm: Secondary | ICD-10-CM | POA: Diagnosis not present

## 2016-04-11 DIAGNOSIS — R1032 Left lower quadrant pain: Secondary | ICD-10-CM | POA: Diagnosis not present

## 2016-04-11 DIAGNOSIS — Z23 Encounter for immunization: Secondary | ICD-10-CM

## 2016-04-11 NOTE — Progress Notes (Signed)
Subjective:    CC: Fatigue  HPI:  Patient comes in today complaining of episodes of weakness. She says on and off she's had episodes where she will feel like her legs lock up and gets weak and lightheaded. She then has to lay herself down on the floor. Usually her husband will bring her a banana and some orange juice and then she'll feel better afterwards. Sometimes it's preceded by a pain in the left lower quadrant but not always. She says it rarely happens at night. Most the time it seems to happen in the morning. Sometimes it occurs after urination. More recently she's been feeling short of breath when the episodes occur, particularly of the last 2 months. She was concerned that maybe her potassium. She started to take a supplement but then stopped after realizing that it said to consult your doctor. She did start a multivitamin about a week and a half ago. She has had a prior history of iron deficiency anemia said also restarted her iron about a month ago.  BP (!) 143/65   Pulse 81   Wt 193 lb (87.5 kg)   SpO2 98%   BMI 33.13 kg/m     Allergies  Allergen Reactions  . Hydrochlorothiazide Other (See Comments)    Dizziness and hypokalemia  . Sulfonamide Derivatives   . Sulfa Antibiotics Rash    Past Medical History:  Diagnosis Date  . Cancer [facility])    H/o breast CA  . Cervical disc disease   . Enlarged thyroid   . Hypertension    hctz- dizziness, toprol- gi upset  . Multinodular goiter 2009   thyroid bx consistent w/ hyperplastic nodules    Past Surgical History:  Procedure Laterality Date  . CHOLECYSTECTOMY      Social History   Social History  . Marital status: Married    Spouse name: N/A  . Number of children: N/A  . Years of education: N/A   Occupational History  . Not on file.   Social History Main Topics  . Smoking status: Never Smoker  . Smokeless tobacco: Never Used  . Alcohol use No  . Drug use: No  . Sexual activity: Not on file   Other Topics  Concern  . Not on file   Social History Narrative  . No narrative on file    Family History  Problem Relation Age of Onset  . Leukemia Mother   . Hypertension Father   . Hypothyroidism Father   . Other Father 22    AMI  . Hypertension Sister   . Hypothyroidism Daughter     Outpatient Encounter Prescriptions as of 04/11/2016  Medication Sig  . Calcium Carbonate (CALCIUM 600 PO) Take by mouth.  . CARTIA XT 300 MG 24 hr capsule TAKE 1 CAPSULE DAILY  . IRON PO Take by mouth.  . letrozole (FEMARA) 2.5 MG tablet Take 2.5 mg by mouth daily.  Marland Kitchen losartan (COZAAR) 100 MG tablet Take 1 tablet (100 mg total) by mouth daily.  . solifenacin (VESICARE) 10 MG tablet Take 1 tablet (10 mg total) by mouth daily. (Patient taking differently: Take 5 mg by mouth daily. )  . [DISCONTINUED] fluticasone (FLONASE) 50 MCG/ACT nasal spray Place 2 sprays into both nostrils daily. (Patient taking differently: Place 2 sprays into both nostrils as needed. )   No facility-administered encounter medications on file as of 04/11/2016.       Review of Systems: No fevers, chills, night sweats, weight loss, chest pain, or shortness  of breath.   Objective:    General: Well Developed, well nourished, and in no acute distress.  Neuro: Alert and oriented x3, extra-ocular muscles intact, sensation grossly intact.  HEENT: Normocephalic, atraumatic  Skin: Warm and dry, no rashes. Cardiac: Regular rate and rhythm, no murmurs rubs or gallops, no lower extremity edema.  Respiratory: Clear to auscultation bilaterally. Not using accessory muscles, speaking in full sentences. ABd: Soft, nontender. No organomegaly. Normal bowel sounds.   Impression and Recommendations:   Episodes of leg spasms and  weakness-unclear etiology. We will check electrolytes as well as magnesium B12 and iron. She does have a prior history of iron deficiency anemia. We'll also check a magnesium level.  She feels like this is most likely potassium  related so we will see. Consider alternative diagnoses such as hypoglycemia since she does seem to feel better when she eats something.  Left lower quadrant pain. It seems to be brief and intermittent and she is nontender on exam today.

## 2016-04-12 LAB — BASIC METABOLIC PANEL WITH GFR
BUN: 12 mg/dL (ref 7–25)
CALCIUM: 9.8 mg/dL (ref 8.6–10.4)
CO2: 28 mmol/L (ref 20–31)
Chloride: 99 mmol/L (ref 98–110)
Creat: 0.65 mg/dL (ref 0.50–1.05)
GFR, Est Non African American: >89 mL/min (ref >=60)
Glucose, Bld: 107 mg/dL — ABNORMAL HIGH (ref 65–99)
POTASSIUM: 4.1 mmol/L (ref 3.5–5.3)
Sodium: 141 mmol/L (ref 135–146)

## 2016-04-12 LAB — FERRITIN: Ferritin: 57 ng/mL (ref 10–232)

## 2016-04-12 LAB — VITAMIN B12: VITAMIN B 12: 609 pg/mL (ref 200–1100)

## 2016-04-12 LAB — MAGNESIUM: MAGNESIUM: 2.1 mg/dL (ref 1.5–2.5)

## 2016-04-12 LAB — FOLATE: Folate: >24 ng/mL (ref >5.4)

## 2016-04-28 ENCOUNTER — Telehealth: Payer: Self-pay

## 2016-04-28 NOTE — Telephone Encounter (Signed)
Joy Perez reports she is having episodes of headaches, dizziness, shaking legs and some chest discomfort for a few days. She checked her blood pressure while having an episode and her blood pressure was 161/86. She has been schedule for an appointment on Thursday. She did take ibuprofen and it helped with the headache. Please advise.

## 2016-04-30 NOTE — Telephone Encounter (Signed)
Left message advising of recommendations.  

## 2016-04-30 NOTE — Telephone Encounter (Signed)
Have her check her blood pressure a couple of times when not having the episodes just to see if her baseline is running high as well. Make sure to avoid all caffeine or stimulants which can raise blood pressure. Really make sure eating a strict low-salt diet. Make sure not skipping any meals. And make sure trying to get adequate sleep at night. Will see her on Thursday.

## 2016-05-01 ENCOUNTER — Ambulatory Visit (INDEPENDENT_AMBULATORY_CARE_PROVIDER_SITE_OTHER): Payer: 59 | Admitting: Family Medicine

## 2016-05-01 ENCOUNTER — Ambulatory Visit (INDEPENDENT_AMBULATORY_CARE_PROVIDER_SITE_OTHER): Payer: 59

## 2016-05-01 ENCOUNTER — Encounter: Payer: Self-pay | Admitting: Family Medicine

## 2016-05-01 VITALS — BP 146/78 | HR 79 | Wt 192.0 lb

## 2016-05-01 DIAGNOSIS — R590 Localized enlarged lymph nodes: Secondary | ICD-10-CM

## 2016-05-01 DIAGNOSIS — J984 Other disorders of lung: Secondary | ICD-10-CM | POA: Diagnosis not present

## 2016-05-01 DIAGNOSIS — R0789 Other chest pain: Secondary | ICD-10-CM | POA: Diagnosis not present

## 2016-05-01 DIAGNOSIS — I10 Essential (primary) hypertension: Secondary | ICD-10-CM

## 2016-05-01 DIAGNOSIS — R51 Headache: Secondary | ICD-10-CM

## 2016-05-01 DIAGNOSIS — R519 Headache, unspecified: Secondary | ICD-10-CM

## 2016-05-01 LAB — COMPLETE METABOLIC PANEL WITH GFR
ALBUMIN: 4.5 g/dL (ref 3.6–5.1)
ALT: 23 U/L (ref 6–29)
AST: 17 U/L (ref 10–35)
Alkaline Phosphatase: 85 U/L (ref 33–130)
BILIRUBIN TOTAL: 0.4 mg/dL (ref 0.2–1.2)
BUN: 16 mg/dL (ref 7–25)
CO2: 25 mmol/L (ref 20–31)
Calcium: 9.6 mg/dL (ref 8.6–10.4)
Chloride: 101 mmol/L (ref 98–110)
Creat: 0.71 mg/dL (ref 0.50–1.05)
GFR, Est African American: >89 mL/min (ref >=60)
GLUCOSE: 110 mg/dL — AB (ref 65–99)
Potassium: 4.2 mmol/L (ref 3.5–5.3)
SODIUM: 141 mmol/L (ref 135–146)
TOTAL PROTEIN: 7.5 g/dL (ref 6.1–8.1)

## 2016-05-01 LAB — CBC WITH DIFFERENTIAL/PLATELET
BASOS PCT: 0 %
Basophils Absolute: 0 cells/uL (ref 0–200)
EOS PCT: 1 %
Eosinophils Absolute: 63 cells/uL (ref 15–500)
HCT: 43.1 % (ref 35.0–45.0)
HEMOGLOBIN: 14.1 g/dL (ref 11.7–15.5)
LYMPHS ABS: 1386 {cells}/uL (ref 850–3900)
Lymphocytes Relative: 22 %
MCH: 28.3 pg (ref 27.0–33.0)
MCHC: 32.7 g/dL (ref 32.0–36.0)
MCV: 86.5 fL (ref 80.0–100.0)
MONOS PCT: 7 %
MPV: 11.4 fL (ref 7.5–12.5)
Monocytes Absolute: 441 cells/uL (ref 200–950)
NEUTROS ABS: 4410 {cells}/uL (ref 1500–7800)
Neutrophils Relative %: 70 %
PLATELETS: 237 10*3/uL (ref 140–400)
RBC: 4.98 MIL/uL (ref 3.80–5.10)
RDW: 14.2 % (ref 11.0–15.0)
WBC: 6.3 10*3/uL (ref 3.8–10.8)

## 2016-05-01 LAB — D-DIMER, QUANTITATIVE: D-Dimer, Quant: 0.37 mcg/mL FEU (ref <0.50)

## 2016-05-01 LAB — TSH: TSH: 4.1 m[IU]/L

## 2016-05-01 MED ORDER — METOPROLOL SUCCINATE ER 25 MG PO TB24: 25.0000 mg | ORAL_TABLET | Freq: Every day | ORAL | 2 refills | Status: DC

## 2016-05-01 MED ORDER — METOPROLOL SUCCINATE ER 25 MG PO TB24: 25.0000 mg | ORAL_TABLET | Freq: Every day | ORAL | 2 refills | Status: AC

## 2016-05-01 MED ORDER — AZITHROMYCIN 250 MG PO TABS: ORAL_TABLET | ORAL | 0 refills | Status: AC

## 2016-05-01 NOTE — Patient Instructions (Signed)
Stop the loratidine.

## 2016-05-01 NOTE — Progress Notes (Signed)
Subjective:    Patient ID: Joy Perez, female    DOB: 1960-08-14, 55 y.o.   MRN: [id number]  HPI   60 you female who had a URI last week.  She says that is better but now having a lot of pressure over her forehead and lots of post nasal drip. Has been taking claritin.   No fever, chills or sweats. Having chest tightness on and off x 2 weeks.  He denies any dizziness but just feels like she is off a little bit in her head. She's also had a few episodes of feeling like her legs felt weak and shaky at times. And her blood pressure has been elevated over the last several weeks. In fact she's been in touch with one of our nurses about it. And she is wondering if that could actually be causing some of her headaches. She denies any GI symptoms such as nausea vomiting or diarrhea.   Review of Systems No ear pain or sore throat.    BP (!) 146/78   Pulse 79   Wt 192 lb (87.1 kg)   SpO2 99%   BMI 32.96 kg/m     Allergies  Allergen Reactions  . Hydrochlorothiazide Other (See Comments)    Dizziness and hypokalemia  . Sulfonamide Derivatives   . Sulfa Antibiotics Rash    Past Medical History:  Diagnosis Date  . Cancer [facility])    H/o breast CA  . Cervical disc disease   . Enlarged thyroid   . Hypertension    hctz- dizziness, toprol- gi upset  . Multinodular goiter 2009   thyroid bx consistent w/ hyperplastic nodules    Past Surgical History:  Procedure Laterality Date  . CHOLECYSTECTOMY      Social History   Social History  . Marital status: Married    Spouse name: N/A  . Number of children: N/A  . Years of education: N/A   Occupational History  . Not on file.   Social History Main Topics  . Smoking status: Never Smoker  . Smokeless tobacco: Never Used  . Alcohol use No  . Drug use: No  . Sexual activity: Not on file   Other Topics Concern  . Not on file   Social History Narrative  . No narrative on file    Family History  Problem Relation Age of Onset   . Leukemia Mother   . Hypertension Father   . Hypothyroidism Father   . Other Father 86    AMI  . Hypertension Sister   . Hypothyroidism Daughter     Outpatient Encounter Prescriptions as of 05/01/2016  Medication Sig  . Calcium Carbonate (CALCIUM 600 PO) Take by mouth.  . CARTIA XT 300 MG 24 hr capsule TAKE 1 CAPSULE DAILY  . IRON PO Take by mouth.  . letrozole (FEMARA) 2.5 MG tablet Take 2.5 mg by mouth daily.  Marland Kitchen loratadine (CLARITIN) 10 MG tablet Take 10 mg by mouth daily.  Marland Kitchen losartan (COZAAR) 100 MG tablet Take 1 tablet (100 mg total) by mouth daily.  . Multiple Vitamins-Minerals (CENTRUM SILVER 50+WOMEN PO) Take 1 capsule by mouth daily.  . Potassium 99 MG TABS Take by mouth.  . solifenacin (VESICARE) 10 MG tablet Take 1 tablet (10 mg total) by mouth daily. (Patient taking differently: Take 5 mg by mouth daily. )  . metoprolol succinate (TOPROL-XL) 25 MG 24 hr tablet Take 1 tablet (25 mg total) by mouth daily.  . [DISCONTINUED] metoprolol succinate (TOPROL-XL) 25  MG 24 hr tablet Take 1 tablet (25 mg total) by mouth daily.   No facility-administered encounter medications on file as of 05/01/2016.          Objective:   Physical Exam  Constitutional: She is oriented to person, place, and time. She appears well-developed and well-nourished.  HENT:  Head: Normocephalic and atraumatic.  Right Ear: External ear normal.  Left Ear: External ear normal.  Nose: Nose normal.  Mouth/Throat: Oropharynx is clear and moist.  bilat ear canals blocked by cerumen  Eyes: Conjunctivae and EOM are normal. Pupils are equal, round, and reactive to light.  Neck: Neck supple. No thyromegaly present.  She has a couple swollen lymph nodes on the right mid cervical area. They are soft but enlarged.  Cardiovascular: Normal rate, regular rhythm and normal heart sounds.   Pulmonary/Chest: Effort normal and breath sounds normal. She has no wheezes.  Lymphadenopathy:    She has cervical adenopathy.   Neurological: She is alert and oriented to person, place, and time.  Skin: Skin is warm and dry. No rash noted.  Psychiatric: She has a normal mood and affect. Her behavior is normal. Thought content normal.       Assessment & Plan:  Frontal headache -unclear etiology. Could be a secondary sinus infection from recent breast for infection 2 weeks ago. Could also be due to her recent elevated blood pressures which have been uncontrolled. Consider treating with antibiotic.  Atypical chest pain-  unlikely to be cardiac. May be just related to the recent upper respiratory illness. Did do an EKG today and will also check a d-dimer.   EKG shows Rate of 72 bpm, normal sinus rhythm with no acute ST-T wave changes. Normal axis. Right bundle branch block. We'll also check thyroid level.  HTN - uncontrolled.  Will add a low dose beta blocker. Follow-up in a couple weeks to recheck blood pressure and make sure that she is improving.

## 2016-05-02 ENCOUNTER — Telehealth: Payer: Self-pay

## 2016-05-02 MED ORDER — FLUTICASONE PROPIONATE 50 MCG/ACT NA SUSP
NASAL | 3 refills | Status: DC
Start: 2016-05-02 — End: 2016-06-23

## 2016-05-02 NOTE — Telephone Encounter (Signed)
Joy Perez complains she still has pressure in her face and head, also a cough. She would like an antibiotic because she believes it may be a sinus infection. Patient is aware Dr Madilyn Fireman is out of the office today. Denies fever, chills or sweats. Patient advised her labs were mostly normal.

## 2016-05-02 NOTE — Telephone Encounter (Signed)
Sounds about right, because treatment is two-part she needs to do intranasal fluticasone and it seems as though Dr. Madilyn Fireman already sent in azithromycin yesterday.

## 2016-05-02 NOTE — Telephone Encounter (Signed)
Patient advised.

## 2016-05-04 ENCOUNTER — Encounter: Payer: Self-pay | Admitting: Family Medicine

## 2016-05-09 ENCOUNTER — Ambulatory Visit: Payer: 59 | Admitting: Family Medicine

## 2016-05-18 ENCOUNTER — Emergency Department (INDEPENDENT_AMBULATORY_CARE_PROVIDER_SITE_OTHER)
Admission: EM | Admit: 2016-05-18 | Discharge: 2016-05-18 | Disposition: A | Discharge status: Home / Self Care | Payer: 59 | Source: Home / Self Care | Attending: Family Medicine | Admitting: Family Medicine

## 2016-05-18 ENCOUNTER — Encounter: Payer: Self-pay | Admitting: Emergency Medicine

## 2016-05-18 DIAGNOSIS — M542 Cervicalgia: Secondary | ICD-10-CM | POA: Diagnosis not present

## 2016-05-18 NOTE — ED Triage Notes (Signed)
Pt c/o right ear pain x 2-3 weeks, feels tight and full, tender behind ear.  Pt did try to clean her ear w/Debrox, got a lot of wax out then began having pain.

## 2016-05-18 NOTE — Discharge Instructions (Signed)
Discontinue ear lavage.  Try taking Ibuprofen 200mg , 4 tabs every 8 hours with food for several days.

## 2016-05-18 NOTE — ED Provider Notes (Signed)
[facility]    CSN: [id number] Arrival date & time: 05/18/16  1442     History   Chief Complaint Chief Complaint  Patient presents with  . Otalgia    HPI Joy Perez is a 55 y.o. female.   Patient complains of pain in her right ear, with sensation of fullness, for about 3 weeks.  She states that she had had a URI prior to that.  She had been prescribed a Z-pak initially.  She had used Debrox 3 weeks ago and was able to remove lavage a significant amount of ear wax afterwards.  She continues to lavage her right ear daily, but still has sensation of fullness in her right ear.  She feels well otherwise.  No fevers, chills, and sweats.   The history is provided by the patient.    Past Medical History:  Diagnosis Date  . Cancer [facility])    H/o breast CA  . Cervical disc disease   . Enlarged thyroid   . Hypertension    hctz- dizziness, toprol- gi upset  . Multinodular goiter 2009   thyroid bx consistent w/ hyperplastic nodules    Patient Active Problem List   Diagnosis Date Noted  . OAB (overactive bladder) 12/08/2014  . Malignant neoplasm of breast (female), unspecified site 04/11/2013  . Iron deficiency anemia 04/06/2012  . INTERMITTENT VERTIGO 12/11/2008  . GOITER, MULTINODULAR 01/11/2008  . NECK PAIN 01/11/2008  . Essential hypertension, benign 01/07/2008  . SHOULDER PAIN, LEFT 12/08/2007    Past Surgical History:  Procedure Laterality Date  . CHOLECYSTECTOMY      OB History    No data available       Home Medications    Prior to Admission medications   Medication Sig Start Date End Date Taking? Authorizing Provider  Calcium Carbonate (CALCIUM 600 PO) Take by mouth.    Historical Provider, MD  CARTIA XT 300 MG 24 hr capsule TAKE 1 CAPSULE DAILY 02/28/16   Hali Marry, MD  fluticasone [facility]) 50 MCG/ACT nasal spray One spray in each nostril twice a day, use left hand for right nostril, and right hand for left nostril. 05/02/16    Silverio Decamp, MD  IRON PO Take by mouth.    Historical Provider, MD  letrozole (FEMARA) 2.5 MG tablet Take 2.5 mg by mouth daily.    Historical Provider, MD  loratadine (CLARITIN) 10 MG tablet Take 10 mg by mouth daily.    Historical Provider, MD  losartan (COZAAR) 100 MG tablet Take 1 tablet (100 mg total) by mouth daily. 06/08/15   Hali Marry, MD  metoprolol succinate (TOPROL-XL) 25 MG 24 hr tablet Take 1 tablet (25 mg total) by mouth daily. 05/01/16   Hali Marry, MD  Multiple Vitamins-Minerals (CENTRUM SILVER 50+WOMEN PO) Take 1 capsule by mouth daily.    Historical Provider, MD  Potassium 99 MG TABS Take by mouth.    Historical Provider, MD  solifenacin (VESICARE) 10 MG tablet Take 1 tablet (10 mg total) by mouth daily. Patient taking differently: Take 5 mg by mouth daily.  12/07/15   Hali Marry, MD    Family History Family History  Problem Relation Age of Onset  . Leukemia Mother   . Hypertension Father   . Hypothyroidism Father   . Other Father 66    AMI  . Hypertension Sister   . Hypothyroidism Daughter     Social History Social History  Substance Use Topics  . Smoking  status: Never Smoker  . Smokeless tobacco: Never Used  . Alcohol use No     Allergies   Hydrochlorothiazide; Sulfonamide derivatives; and Sulfa antibiotics   Review of Systems Review of Systems No sore throat No cough No pleuritic pain No wheezing No nasal congestion No post-nasal drainage No sinus pain/pressure No itchy/red eyes ? earache No hemoptysis No SOB No fever/chills No nausea No vomiting No abdominal pain No diarrhea No urinary symptoms No skin rash No fatigue No myalgias No headache Used OTC meds without relief   Physical Exam Triage Vital Signs ED Triage Vitals  Enc Vitals Group     BP 05/18/16 1501 (!) 163/120     Pulse Rate 05/18/16 1501 80     Resp --      Temp 05/18/16 1501 97.7 F (36.5 C)     Temp Source 05/18/16 1501  Oral     SpO2 05/18/16 1501 99 %     Weight 05/18/16 1502 190 lb 4 oz (86.3 kg)     Height 05/18/16 1502 5\' 4"  (1.626 m)     Head Circumference --      Peak Flow --      Pain Score 05/18/16 1503 5     Pain Loc --      Pain Edu? --      Excl. in [place]? --    No data found.   Updated Vital Signs BP (!) 163/120 (BP Location: Left Arm)   Pulse 80   Temp 97.7 F (36.5 C) (Oral)   Ht 5\' 4"  (1.626 m)   Wt 190 lb 4 oz (86.3 kg)   SpO2 99%   BMI 32.66 kg/m   Visual Acuity Right Eye Distance:   Left Eye Distance:   Bilateral Distance:    Right Eye Near:   Left Eye Near:    Bilateral Near:     Physical Exam Nursing notes and Vital Signs reviewed. Appearance:  Patient appears stated age, and in no acute distress Eyes:  Pupils are equal, round, and reactive to light and accomodation.  Extraocular movement is intact.  Conjunctivae are not inflamed  Ears:  Left canal is partly occluded with cerumen, but left tympanic membrane is visualized and appears normal.  Right canal is clear and appears completely normal without evidence inflammation.  Right tympanic membrane is normal.  No TMJ tenderness.  No parotid gland tenderness. Nose:  Mildly congested turbinates.  No sinus tenderness.   Pharynx:  Normal Neck:  Supple.   Mildly tender posterior/lateral nodes are palpated bilaterally  Lungs:  Clear to auscultation.  Breath sounds are equal.  Moving air well. Heart:  Regular rate and rhythm without murmurs, rubs, or gallops.  Abdomen:  Nontender without masses or hepatosplenomegaly.  Bowel sounds are present.  No CVA or flank tenderness.  Extremities:  No edema.  Skin:  No rash present.    UC Treatments / Results  Labs (all labs ordered are listed, but only abnormal results are displayed)  Labs Reviewed -   Tympanometry:  Right ear tympanogram normal; Left ear tympanogram normal   EKG  EKG Interpretation None       Radiology No results found.  Procedures Procedures  (including critical care time)  Medications Ordered in UC Medications - No data to display   Initial Impression / Assessment and Plan / UC Course  I have reviewed the triage vital signs and the nursing notes.  Pertinent labs & imaging results that were available during my care  of the patient were reviewed by me and considered in my medical decision making (see chart for details).  Clinical Course   No etiology for patient's right neck pain. Discontinue ear lavage.  Try taking Ibuprofen 200mg , 4 tabs every 8 hours with food for several days. Followup with ENT if symptoms persist 10 days.     Final Clinical Impressions(s) / UC Diagnoses   Final diagnoses:  Neck pain on right side    New Prescriptions New Prescriptions   No medications on file     Kandra Nicolas, MD 05/19/16 1321

## 2016-05-28 ENCOUNTER — Other Ambulatory Visit: Payer: Self-pay | Admitting: Family Medicine

## 2016-06-06 ENCOUNTER — Ambulatory Visit: Payer: 59 | Admitting: Family Medicine

## 2016-06-23 ENCOUNTER — Other Ambulatory Visit: Payer: Self-pay

## 2016-06-23 MED ORDER — FLUTICASONE PROPIONATE 50 MCG/ACT NA SUSP: NASAL | 3 refills | Status: AC

## 2016-07-11 ENCOUNTER — Other Ambulatory Visit: Payer: Self-pay | Admitting: Family Medicine

## 2016-10-15 ENCOUNTER — Other Ambulatory Visit: Payer: Self-pay | Admitting: Family Medicine

## 2016-11-19 ENCOUNTER — Encounter: Payer: Self-pay | Admitting: Family Medicine

## 2016-11-24 ENCOUNTER — Other Ambulatory Visit: Payer: Self-pay | Admitting: Family Medicine

## 2016-11-28 ENCOUNTER — Other Ambulatory Visit: Payer: Self-pay

## 2016-11-28 MED ORDER — DILTIAZEM HCL ER COATED BEADS 300 MG PO CP24: 300.0000 mg | ORAL_CAPSULE | Freq: Every day | ORAL | 0 refills | Status: AC

## 2018-02-21 IMAGING — DX DG CHEST 2V
2 series · 2 of 2 positions shown · non-contrast
Comparison: 04/05/2012

CLINICAL DATA: Chest tightness for several weeks with cough,
initial encounter

EXAM:
CHEST  2 VIEW

[chest pa]
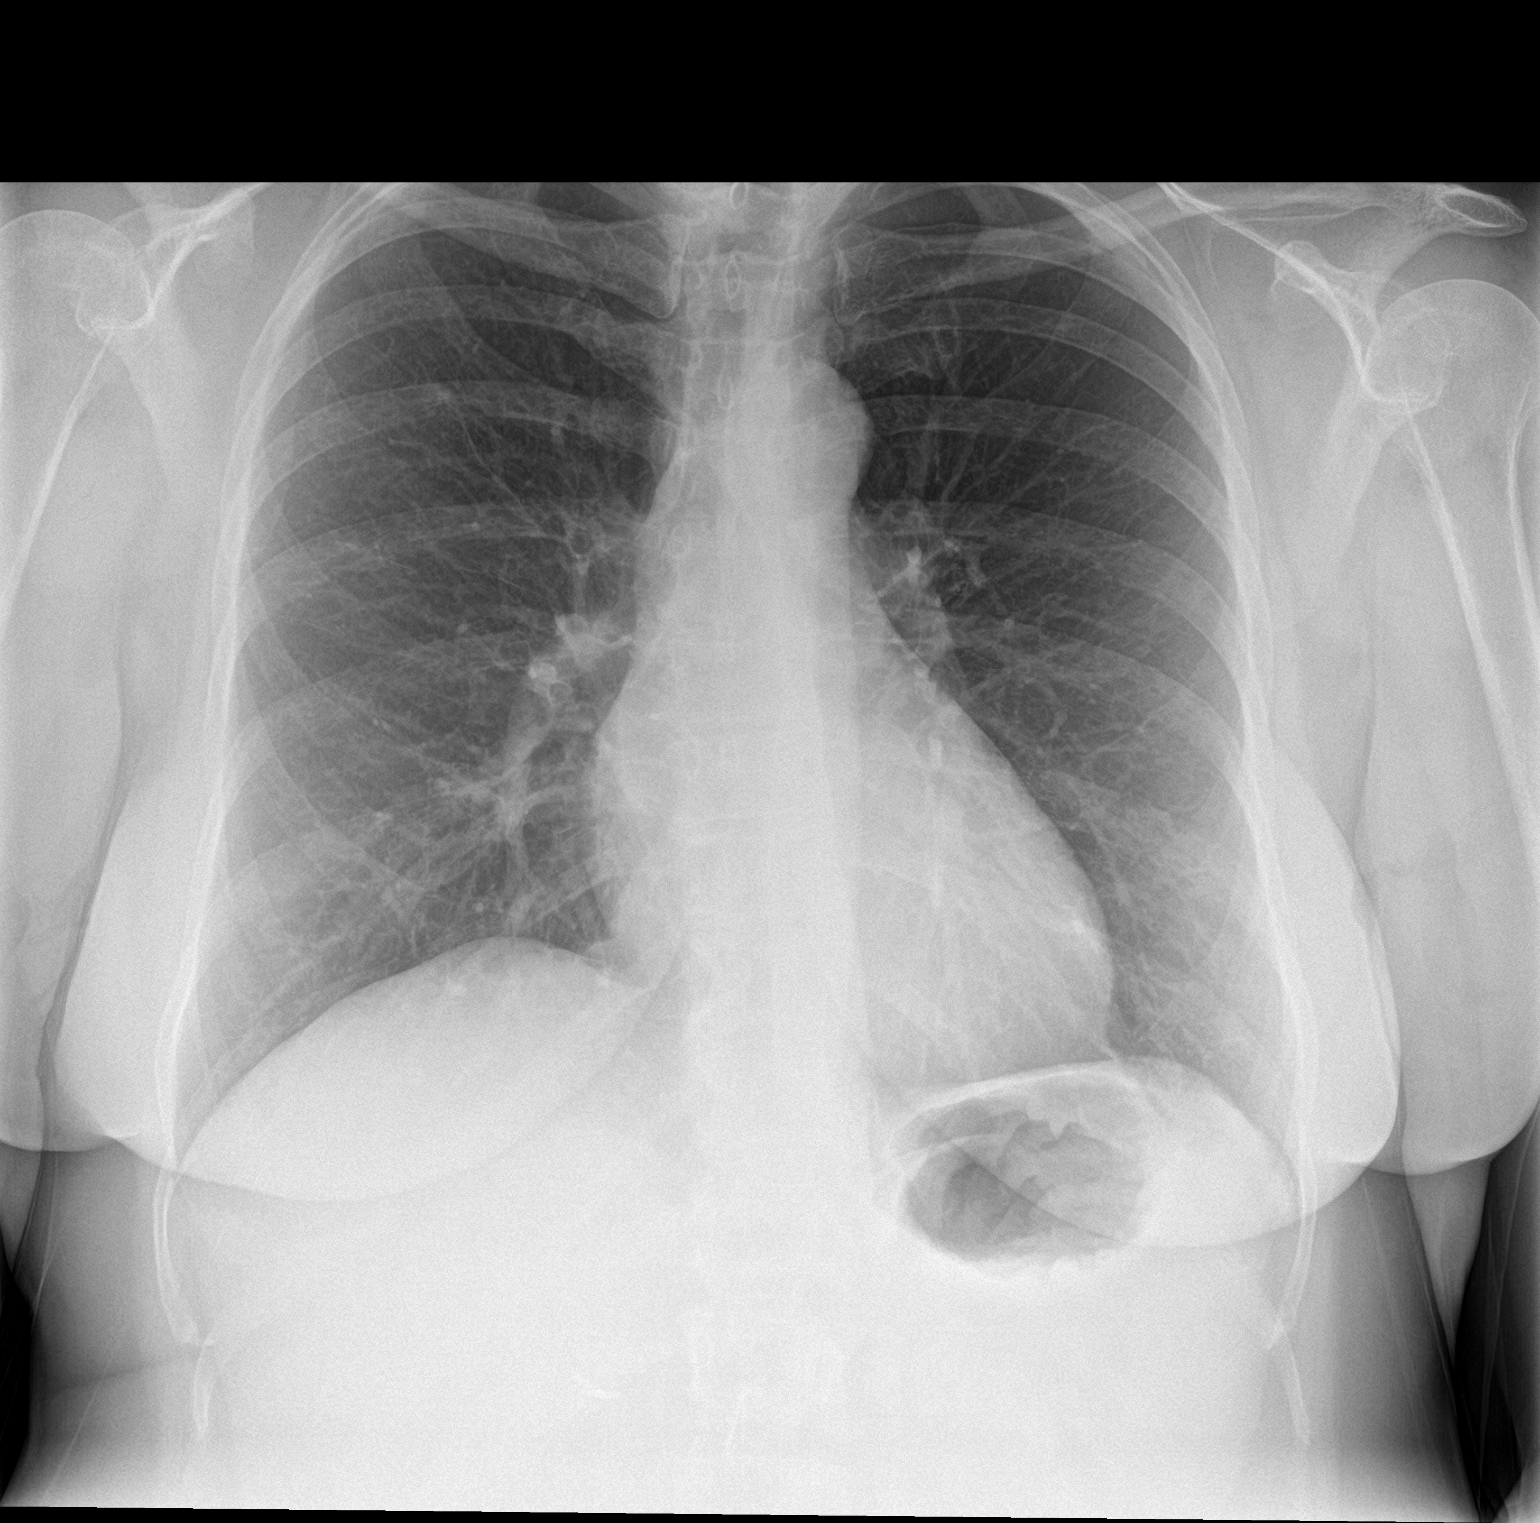

[chest lat]
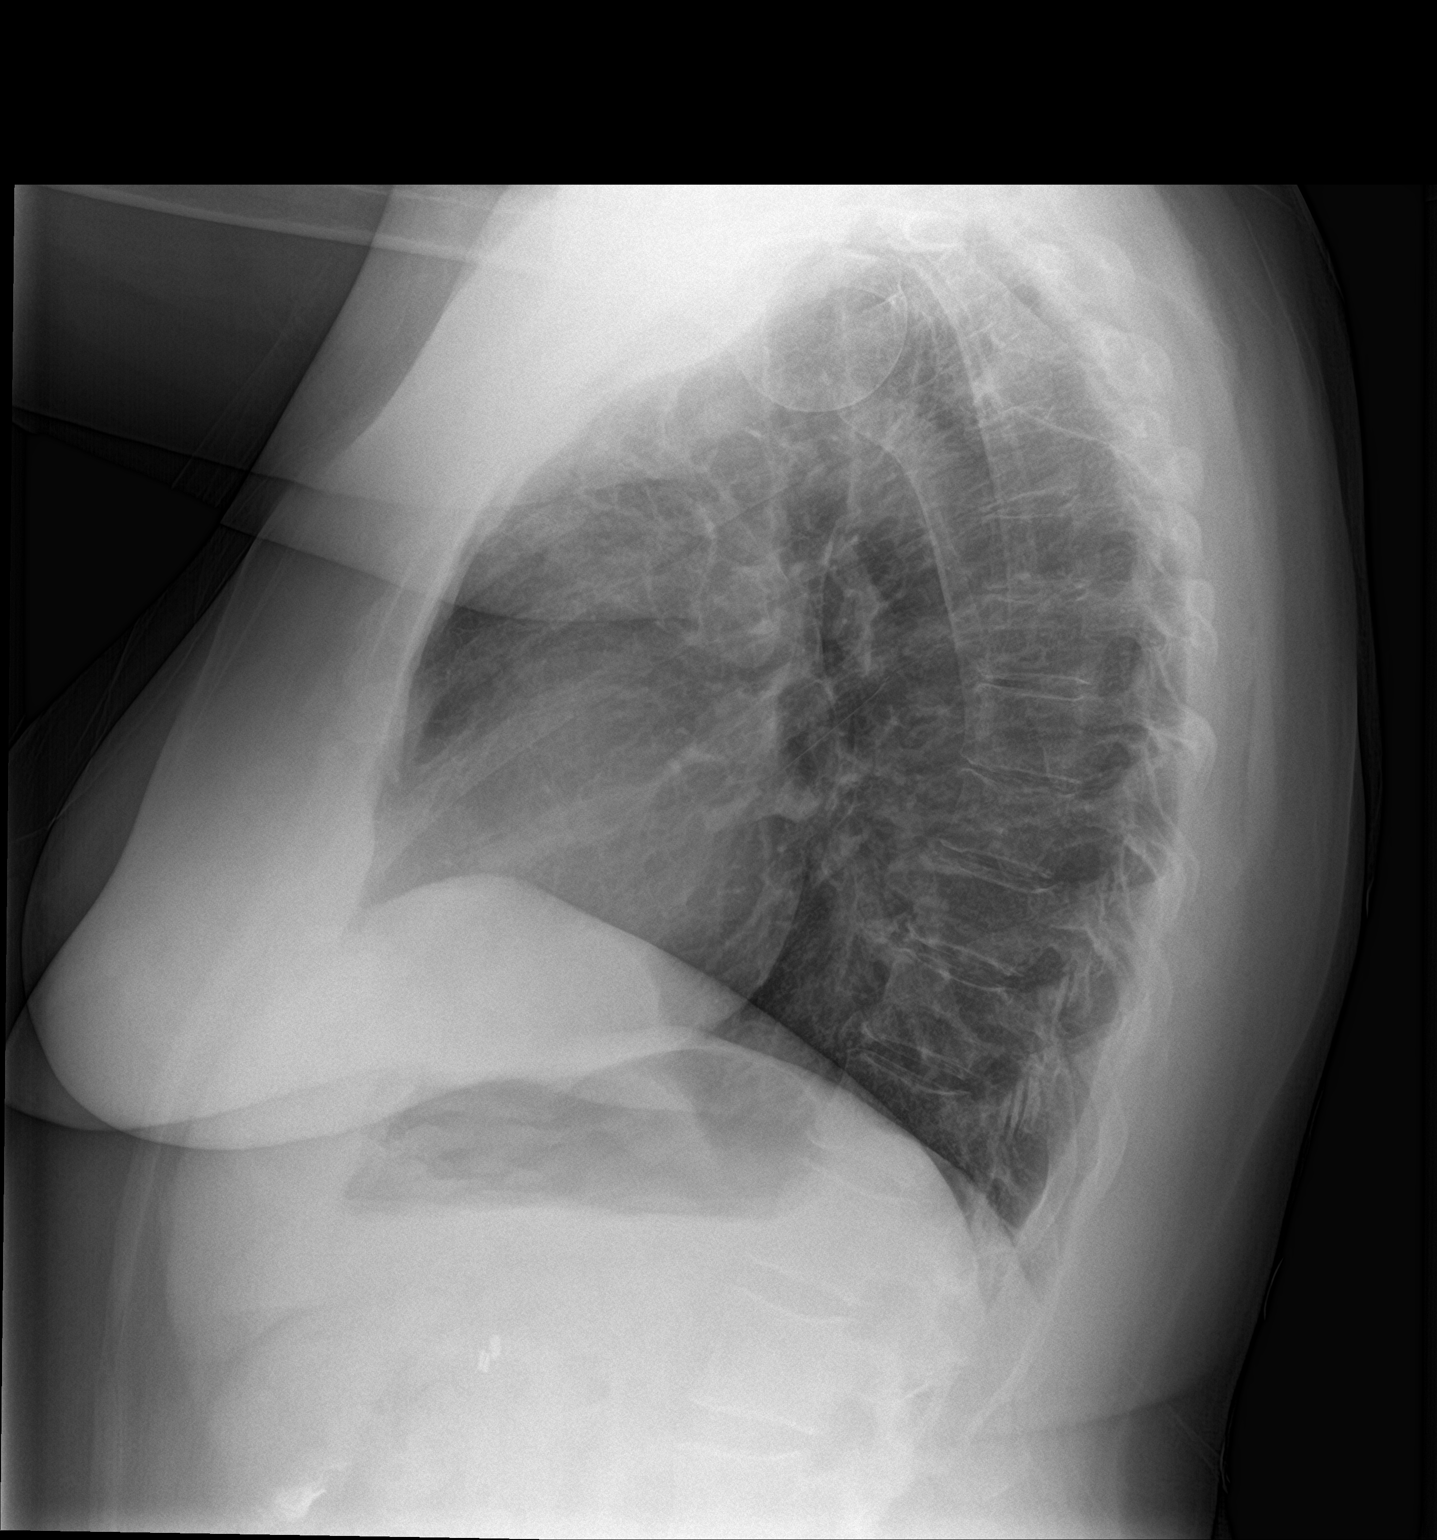

[2 of 2 positions shown; findings below may reference images not displayed]

FINDINGS: Cardiac shadow is within normal limits. The lungs are well aerated
bilaterally. A tiny nodular densities noted in the right upper lobe
which may represent a small granuloma. This was not as well seen on
recent exams. Lordotic view may be helpful for further evaluation.
IMPRESSION: Tiny nodular density in the right upper lobe laterally. This likely
represents a calcified granuloma. Lordotic view may be helpful for
further evaluation.

## 2018-04-13 IMAGING — US US ABDOMEN COMPLETE
1 series · 13 of 25 positions shown · non-contrast
Comparison: None in PACs

CLINICAL DATA: Urinary tract infection 2 months ago with persistent
right flank tightness; history of breast malignancy,
cholecystectomy, goiter, and hypertension.

EXAM:
ABDOMEN ULTRASOUND COMPLETE

[Series 1: us abdomen complete · 0.28mm/px · 13 of 93 slices shown]
[im 1/93]
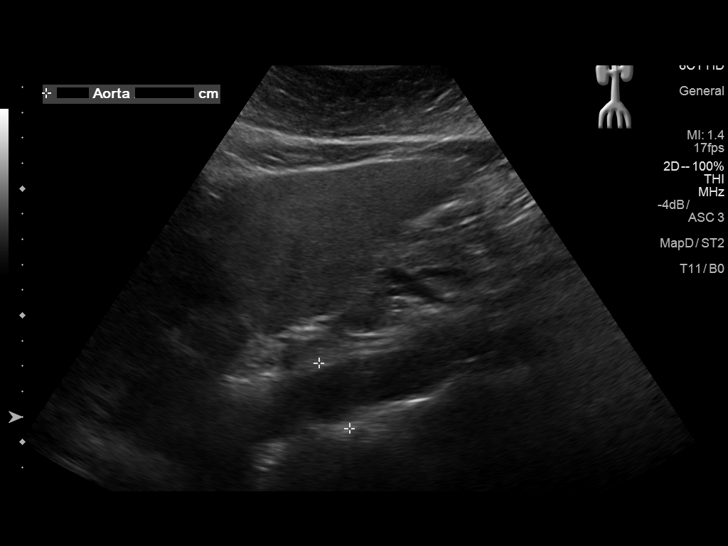
[im 8/93]
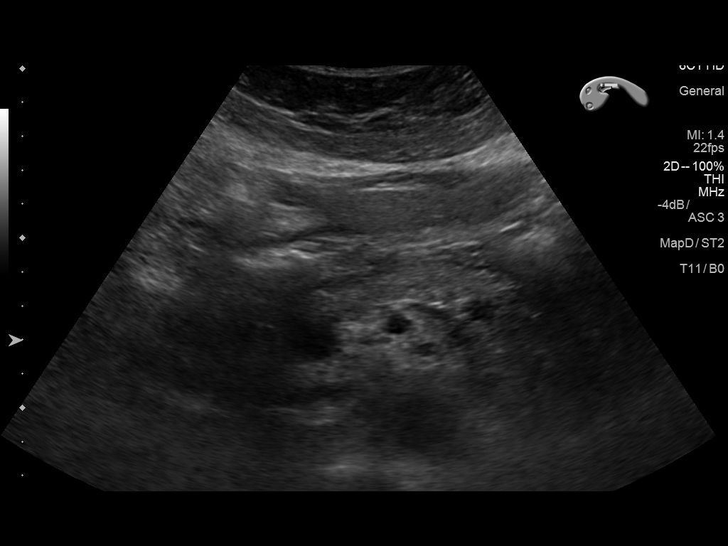
[im 16/93]
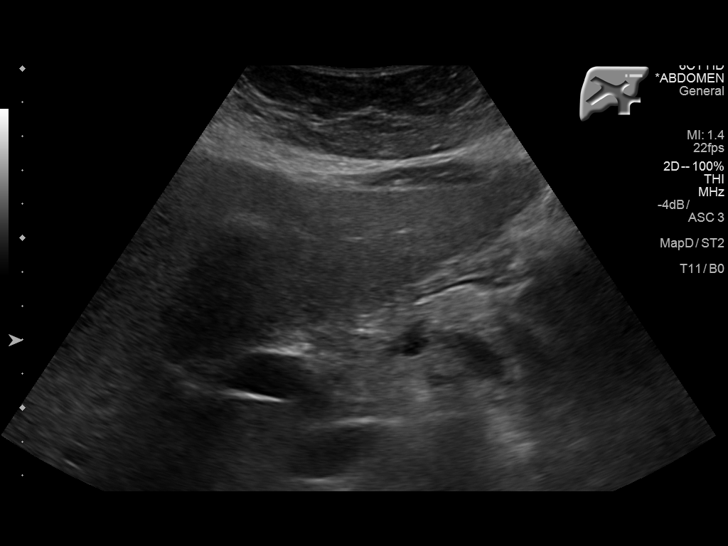
[im 24/93]
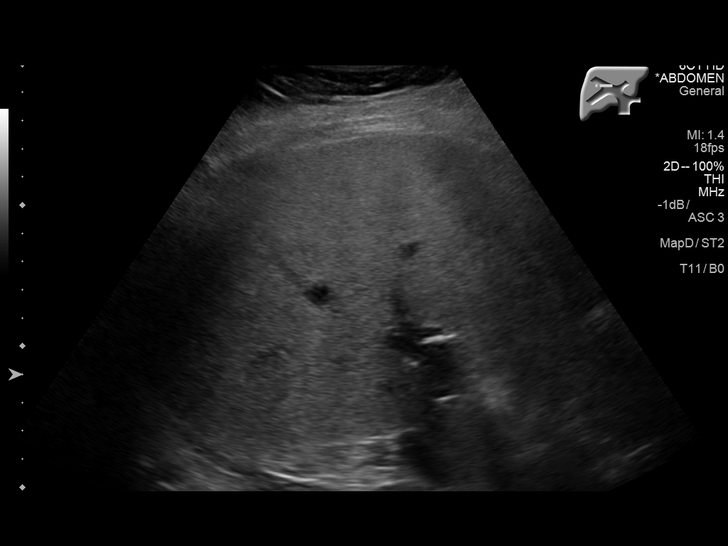
[im 31/93]
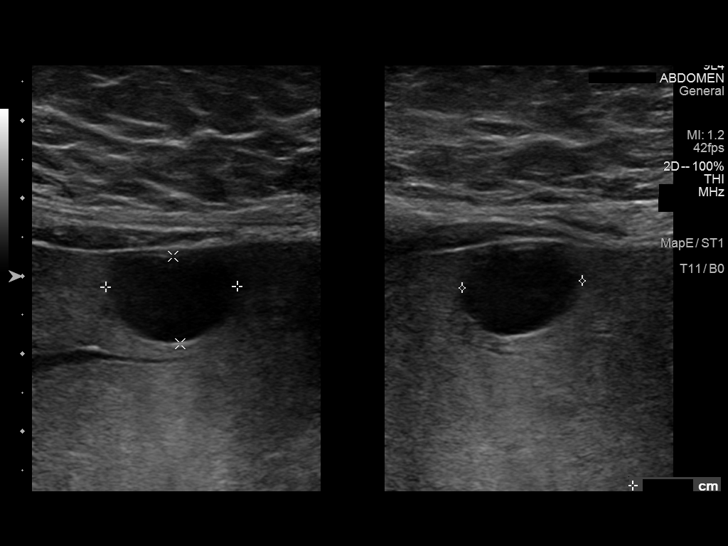
[im 39/93]
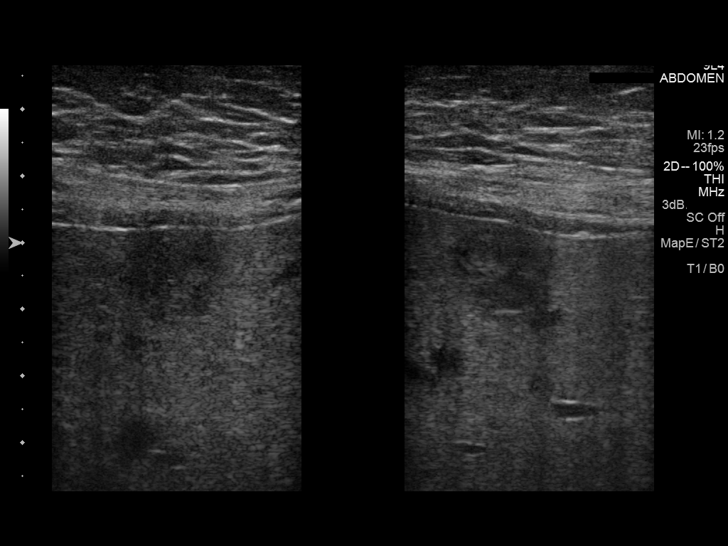
[im 47/93]
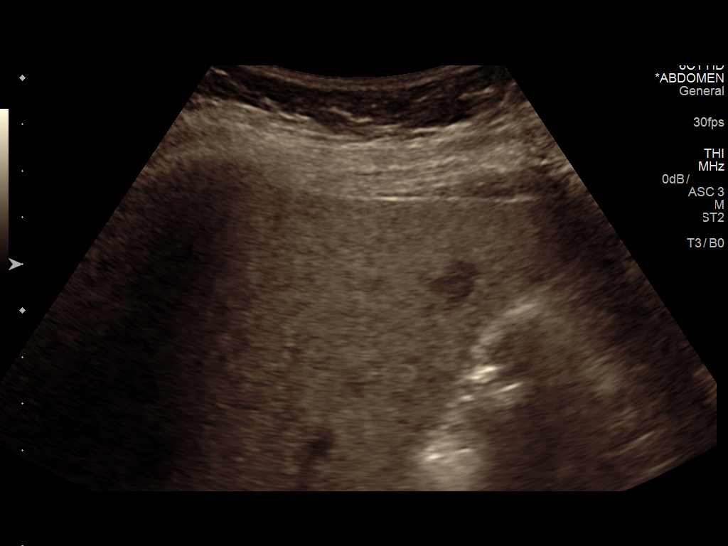
[im 54/93]
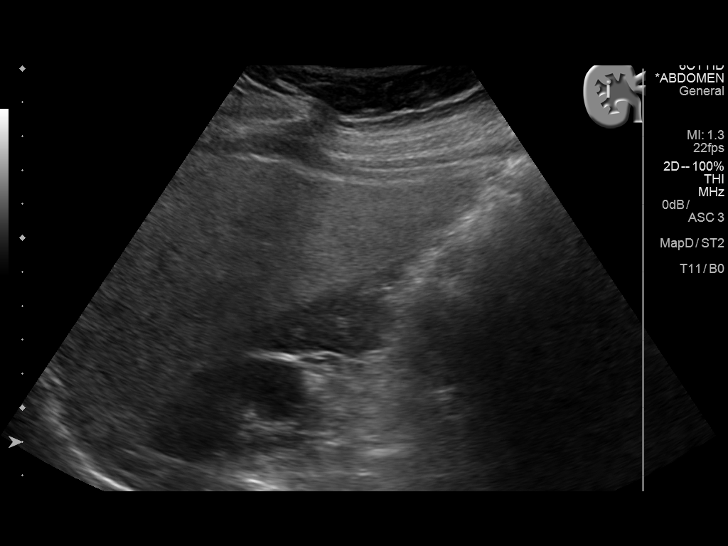
[im 62/93]
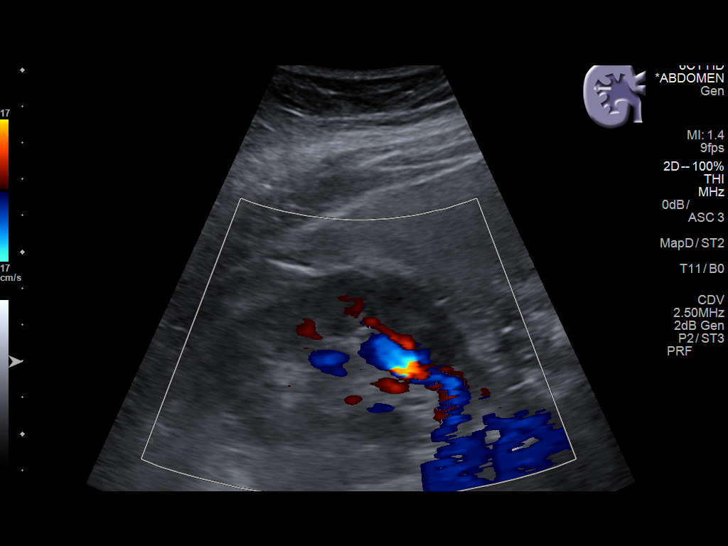
[im 70/93]
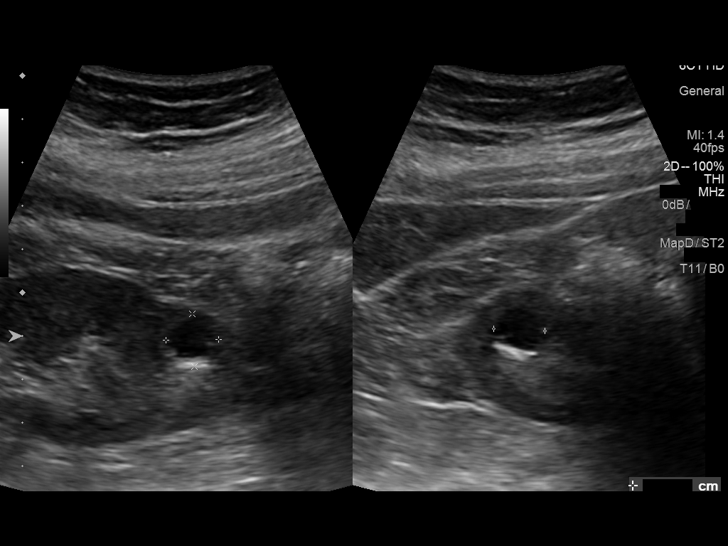
[im 77/93]
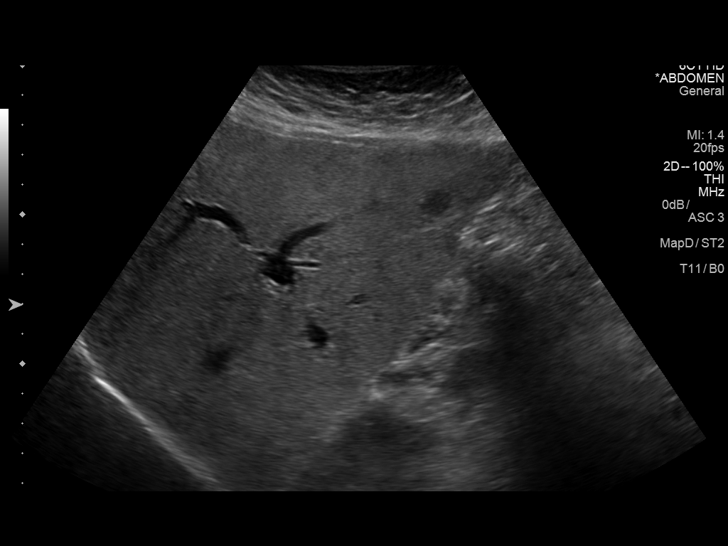
[im 85/93]
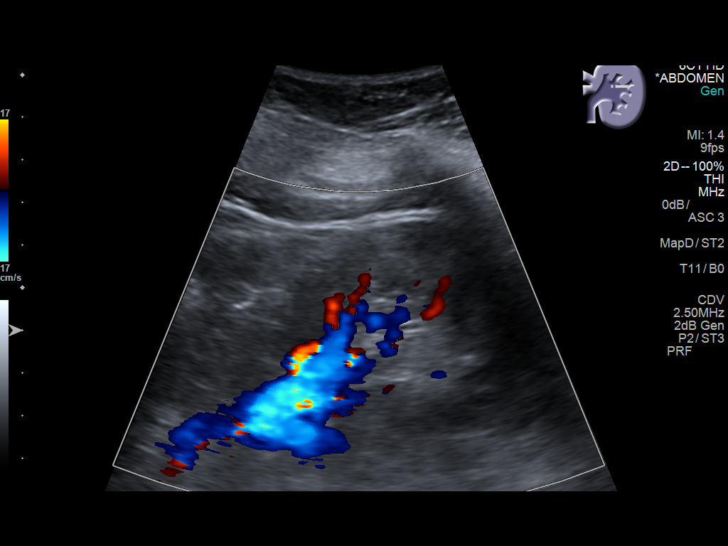
[im 93/93]
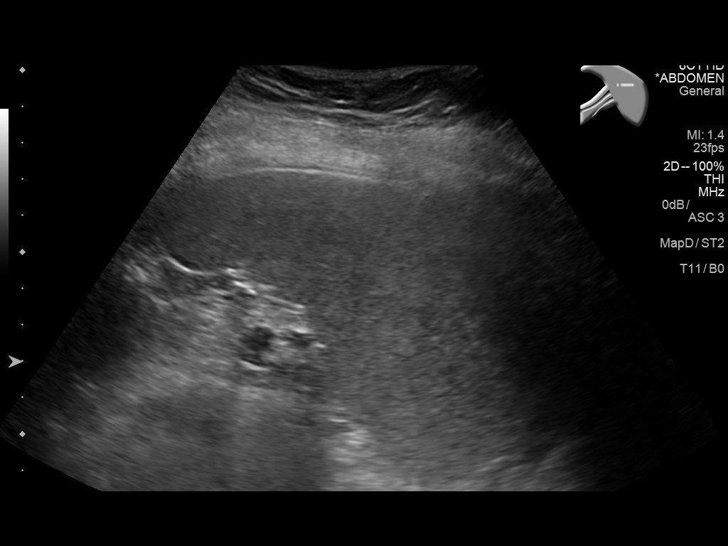

[13 of 25 positions shown; findings below may reference images not displayed]

FINDINGS: Gallbladder: The gallbladder is surgically absent.

Common bile duct: Diameter: 5 mm

Liver: The hepatic echotexture is mildly increased. In the anterior
aspect of the right hepatic lobe there is a hypoechoic mass
measuring 1.6 x 1.4 x 1.6 cm. Other areas of decreased echogenicity
are noted. There is a cystic structure in the right lobe measuring
1.7 x 1.1 x 1.6 cm. There is no intrahepatic ductal dilation.

IVC: Bowel gas limits evaluation of the inferior vena cava.

Pancreas: Bowel gas limits evaluation of the pancreas.

Spleen: The spleen is normal in size and echotexture.

Right Kidney: Length: 11.8 cm. Echogenicity within normal limits. No
hydronephrosis visualized. Probable cysts in the upper and lower
poles of the right kidney measuring up to 2 cm in diameter.

Left Kidney: Length: 12 cm. The renal cortical echotexture is
normal.

Abdominal aorta: No aneurysm visualized.

Other findings: No ascites is observed.
IMPRESSION: 1. Abnormal solid-appearing masses in the right hepatic lobe
worrisome for metastatic disease. A simple appearing right hepatic
lobe cyst is visible as well. Hepatic protocol MRI is recommended.
2. Probable cyst in the upper and lower pole of the right kidney.
The lower pole cyst has mural calcification. There is no
hydronephrosis.
3. There is no ascites.
4. These results will be called to the ordering clinician or
representative by the Radiologist Assistant, and communication
documented in the PACS or zVision Dashboard.
